# Patient Record
Sex: Female | Born: 1960 | Race: Asian | Hispanic: No | Smoking: Never smoker
Health system: Southern US, Community
[De-identification: ages and names within clinical notes are randomized; demographics above are authoritative.]

## PROBLEM LIST (undated history)

## (undated) DIAGNOSIS — I10 Essential (primary) hypertension: Secondary | ICD-10-CM

## (undated) DIAGNOSIS — G4710 Hypersomnia, unspecified: Secondary | ICD-10-CM

## (undated) DIAGNOSIS — R0683 Snoring: Secondary | ICD-10-CM

## (undated) DIAGNOSIS — E785 Hyperlipidemia, unspecified: Secondary | ICD-10-CM

## (undated) DIAGNOSIS — T7840XA Allergy, unspecified, initial encounter: Secondary | ICD-10-CM

## (undated) HISTORY — DX: Snoring: R06.83

## (undated) HISTORY — DX: Essential (primary) hypertension: I10

## (undated) HISTORY — DX: Hyperlipidemia, unspecified: E78.5

## (undated) HISTORY — DX: Hypersomnia, unspecified: G47.10

## (undated) HISTORY — DX: Allergy, unspecified, initial encounter: T78.40XA

---

## 2001-06-26 ENCOUNTER — Emergency Department (HOSPITAL_COMMUNITY): Admission: EM | Admit: 2001-06-26 | Discharge: 2001-06-26 | Payer: Self-pay | Admitting: Emergency Medicine

## 2001-06-26 ENCOUNTER — Encounter: Payer: Self-pay | Admitting: Emergency Medicine

## 2002-06-29 ENCOUNTER — Inpatient Hospital Stay (HOSPITAL_COMMUNITY): Admission: AD | Admit: 2002-06-29 | Discharge: 2002-06-29 | Payer: Self-pay | Admitting: Family Medicine

## 2006-08-06 ENCOUNTER — Emergency Department (HOSPITAL_COMMUNITY): Admission: EM | Admit: 2006-08-06 | Discharge: 2006-08-07 | Payer: Self-pay | Admitting: Emergency Medicine

## 2008-01-03 ENCOUNTER — Ambulatory Visit: Payer: Self-pay | Admitting: Cardiology

## 2008-01-04 ENCOUNTER — Ambulatory Visit: Payer: Self-pay | Admitting: Cardiology

## 2008-01-04 LAB — CONVERTED CEMR LAB
Cholesterol: 161 mg/dL (ref 0–200)
HDL: 50 mg/dL (ref >39.0)
LDL Cholesterol: 90 mg/dL (ref 0–99)
Total CHOL/HDL Ratio: 3.2
Triglycerides: 106 mg/dL (ref 0–149)
VLDL: 21 mg/dL (ref 0–40)

## 2008-01-17 ENCOUNTER — Ambulatory Visit: Payer: Self-pay

## 2008-01-17 ENCOUNTER — Ambulatory Visit: Payer: Self-pay | Admitting: Cardiology

## 2008-02-06 ENCOUNTER — Ambulatory Visit: Payer: Self-pay | Admitting: Cardiology

## 2009-01-02 ENCOUNTER — Encounter (INDEPENDENT_AMBULATORY_CARE_PROVIDER_SITE_OTHER): Payer: Self-pay | Admitting: *Deleted

## 2010-09-23 NOTE — Procedures (Signed)
[facility]                              EXERCISE MELL, MELLOTT ANN                     MRN:          [id number]  DATE:01/17/2008                            DOB:          [birth year]    INDICATION:  Multiple cardiac risk factors including diabetes in a  patient who would like to increase her level of exercise.   PROCEDURE:  The patient exercised using the standard Bruce protocol on  the treadmill.  She reached 95% of her predicted maximum heart rate with  a peak heart rate of 164.  She exercised for 10 minutes, which is 11.80  METS.  She did reach a maximal blood pressure of 201/101 at peak  exercise signifying a hypertensive blood pressure response.  On review  of her exercise electrocardiogram, there is no evidence for ischemic ST  segment changes.  There are some mild upsloping ST depressions at peak  exercise that are nonspecific.  The patient had no chest pain, exercise  was terminated due to fatigue.   ASSESSMENT/PLAN:  This is a negative exercise treadmill for ischemia.  She did have excellent exercise capacity reaching 11.8 METS. There was a  hypertensive blood pressure response.  I will change the patient's  metoprolol to Coreg 12.5 mg po bid for its better blood pressure  lowering capacity.     Marca Ancona, MD  Electronically Signed    DM/MedQ  DD: 01/17/2008  DT: 01/18/2008  Job #: [id number]   cc:   Jonita Albee, M.D.  Noralyn Pick. Eden Emms, MD, [facility]

## 2010-09-23 NOTE — Assessment & Plan Note (Signed)
[facility]                            CARDIOLOGY OFFICE NOTE   Nancy Evans, Nancy Evans                     MRN:          [id number]  DATE:01/03/2008                            DOB:          11-20-1960    PRIMARY CARE PHYSICIAN:  Dr. Thayer Ohm Guest.   HISTORY OF PRESENT ILLNESS:  This is a 50 year old with history of  hypertension and recently diagnosed type 2 diabetes who was referred to  cardiology clinic for evaluation for coronary disease in the setting of  multiple coronary artery disease risk factors.  The patient was recently  diagnosed with diabetes.  She does have hypertension.  She also has a  family history of early coronary artery disease and that her sister had  a heart attack apparently at the age of 39.  The patient states that she  actually does not have a whole lot of symptomatology.  She tries to walk  30-45 minutes every night with her husband.  She denies any dyspnea on  exertion while walking.  She is able to climb stairs without difficulty.  She has had no episodes of chest pain.  She has had no episodes of  syncope or presyncope.  She does not get pain in her legs when she  walks.  She does not smoke.  She has not been checking her blood sugar  level; however, she does have machine and she is just waiting to be  taught how to use it.  She does bring along her home blood pressure  numbers which show systolic blood pressure mainly in the 130s to 140s.   PAST MEDICAL HISTORY:  1. Type 2 diabetes.  2. Hypertension.  3. History of angioedema with ACE inhibitor.   EKG today, normal sinus rhythm with nonspecific T-wave flattening.  Most  recent labs July 2009, hemoglobin A1c of 7.5.   SOCIAL HISTORY:  The patient does not smoke, does not drink alcohol.  She is married.  She has two grown children.  She does work, and she has  a sedentary job at a [employer].   FAMILY HISTORY:  The patient's father had a heart attack at the age of  48,  the patient's sister apparently had a heart attack at the age of 22,  and the patient's aunt also had a heart attack though she is not sure  how old she was at that time.   MEDICATIONS:  1. Metformin 500 mg b.i.d.  2. Norvasc 10 mg daily.  3. Aspirin 81 mg daily.  4. Metoprolol 50 mg b.i.d.   REVIEW OF SYSTEMS:  Negative except as noted in history of present  illness.   PHYSICAL EXAMINATION:  VITAL SIGNS:  Blood pressure 139/90, heart rate  70 and regular, and weight is 149 pounds.  GENERAL:  No apparent distress.  This is a mildly obese female.  NEUROLOGICAL:  Alert and oriented x3.  Normal affect.  NECK:  No JVD, no thyromegaly, and no thyroid nodule.  LUNGS:  Clear to auscultation bilaterally.  Normal respiratory effort.  CARDIOVASCULAR:  Heart, regular S1, S2.  No S3.  No  S4.  There is a soft  1/6 crescendo-decrescendo systolic murmur at the upper sternal border.  There is no peripheral edema.  A 2+ dorsalis pedis pulses bilaterally.  No carotid bruit.  ABDOMEN:  Soft, nontender, and mildly obese.  No hepatosplenomegaly.  Normal bowel sounds.  EXTREMITIES:  No clubbing or cyanosis.  MUSCULOSKELETAL:  Normal exam.  SKIN:  Normal exam.  HEENT:  Normal exam.   ASSESSMENT/PLAN:  This is a 50 year old with diabetes and hypertension  as well as a strong family history of coronary disease who presents for  evaluation of her coronary disease risk factors.  1. Hypertension.  The patient's blood pressure is still above her goal      of less than 130/80.  Her blood pressure log shows it does tend to      run a little bit high.  We will continue her on her Norvasc and on      her metoprolol.  We will add hydrochlorothiazide 25 mg daily to her      regimen.  She is not able to use an ACE or an ARB due to the      history of angioedema.  2. We will need to check the patient's lipids.  My goal LDL for her      would be less than 70.  If her LDL is elevated above this level, I      would  have a low threshold to start statin.  3. Type 2 diabetes.  The patient is newly diagnosed.  She is on      metformin 500 mg twice daily.  She has been to see the nutritionist      at [facility]  We talked about weight loss.  4. Coronary disease risk.  The patient is asymptomatic.  However, she      does have multiple risk factors for coronary disease.  She will      continue on her 81 mg aspirin and we will also set her up for an      exercise treadmill test.  She does have an essentially normal EKG.      This is especially given the fact that she does want to increase      the level of her exercise regimen, so she will be able to lose      weight.      Marca Ancona, MD  Electronically Signed    DM/MedQ  DD: 01/03/2008  DT: 01/04/2008  Job #: [id number]   cc:   Jonita Albee, M.D.  Arturo Morton. Riley Kill, MD, [facility]

## 2011-11-11 ENCOUNTER — Other Ambulatory Visit: Payer: Self-pay | Admitting: Family Medicine

## 2011-12-11 ENCOUNTER — Ambulatory Visit (INDEPENDENT_AMBULATORY_CARE_PROVIDER_SITE_OTHER): Payer: Federal, State, Local not specified - PPO | Admitting: Family Medicine

## 2011-12-11 VITALS — BP 208/108 | HR 63 | Temp 97.9°F | Resp 16 | Ht 58.5 in | Wt 143.6 lb

## 2011-12-11 DIAGNOSIS — E785 Hyperlipidemia, unspecified: Secondary | ICD-10-CM

## 2011-12-11 DIAGNOSIS — E119 Type 2 diabetes mellitus without complications: Secondary | ICD-10-CM | POA: Insufficient documentation

## 2011-12-11 DIAGNOSIS — J309 Allergic rhinitis, unspecified: Secondary | ICD-10-CM

## 2011-12-11 DIAGNOSIS — I10 Essential (primary) hypertension: Secondary | ICD-10-CM | POA: Insufficient documentation

## 2011-12-11 LAB — COMPREHENSIVE METABOLIC PANEL
ALT: 34 U/L (ref 0–35)
AST: 27 U/L (ref 0–37)
Albumin: 4.4 g/dL (ref 3.5–5.2)
Alkaline Phosphatase: 42 U/L (ref 39–117)
BUN: 13 mg/dL (ref 6–23)
CO2: 27 mEq/L (ref 19–32)
Calcium: 9.3 mg/dL (ref 8.4–10.5)
Chloride: 105 mEq/L (ref 96–112)
Creat: 0.51 mg/dL (ref 0.50–1.10)
Glucose, Bld: 148 mg/dL — ABNORMAL HIGH (ref 70–99)
Potassium: 3.7 mEq/L (ref 3.5–5.3)
Sodium: 139 mEq/L (ref 135–145)
Total Bilirubin: 0.7 mg/dL (ref 0.3–1.2)
Total Protein: 7 g/dL (ref 6.0–8.3)

## 2011-12-11 LAB — POCT CBC
Granulocyte percent: 65.9 %G (ref 37–80)
HCT, POC: 45.6 % (ref 37.7–47.9)
Hemoglobin: 14.3 g/dL (ref 12.2–16.2)
Lymph, poc: 1.8 (ref 0.6–3.4)
MCH, POC: 27.2 pg (ref 27–31.2)
MCHC: 31.4 g/dL — AB (ref 31.8–35.4)
MCV: 86.9 fL (ref 80–97)
MID (cbc): 0.5 (ref 0–0.9)
MPV: 8.2 fL (ref 0–99.8)
POC Granulocyte: 4.5 (ref 2–6.9)
POC LYMPH PERCENT: 27.2 %L (ref 10–50)
POC MID %: 6.9 %M (ref 0–12)
Platelet Count, POC: 294 10*3/uL (ref 142–424)
RBC: 5.25 M/uL (ref 4.04–5.48)
RDW, POC: 13.9 %
WBC: 6.8 10*3/uL (ref 4.6–10.2)

## 2011-12-11 LAB — LIPID PANEL
Cholesterol: 140 mg/dL (ref 0–200)
HDL: 38 mg/dL — ABNORMAL LOW (ref >39)
LDL Cholesterol: 71 mg/dL (ref 0–99)
Total CHOL/HDL Ratio: 3.7 Ratio
Triglycerides: 157 mg/dL — ABNORMAL HIGH (ref <150)
VLDL: 31 mg/dL (ref 0–40)

## 2011-12-11 LAB — POCT GLYCOSYLATED HEMOGLOBIN (HGB A1C): Hemoglobin A1C: 9

## 2011-12-11 MED ORDER — SIMVASTATIN 20 MG PO TABS: 20.0000 mg | ORAL_TABLET | Freq: Every evening | ORAL | Status: DC

## 2011-12-11 MED ORDER — METFORMIN HCL 1000 MG PO TABS: 1000.0000 mg | ORAL_TABLET | Freq: Two times a day (BID) | ORAL | Status: DC

## 2011-12-11 MED ORDER — CARVEDILOL 12.5 MG PO TABS: 12.5000 mg | ORAL_TABLET | Freq: Two times a day (BID) | ORAL | Status: DC

## 2011-12-11 MED ORDER — FEXOFENADINE HCL 180 MG PO TABS: 180.0000 mg | ORAL_TABLET | Freq: Every day | ORAL | Status: DC

## 2011-12-11 MED ORDER — MOMETASONE FUROATE 50 MCG/ACT NA SUSP: 2.0000 | NASAL | Status: DC | PRN

## 2011-12-11 MED ORDER — LOSARTAN POTASSIUM-HCTZ 100-12.5 MG PO TABS: 1.0000 | ORAL_TABLET | Freq: Every day | ORAL | Status: DC

## 2011-12-11 NOTE — Progress Notes (Signed)
@[facility] @  Patient ID: Nancy Evans MRN: [id number], DOB: 1961/03/19, 51 y.o. Date of Encounter: 12/11/2011, 10:41 AM  Primary Physician: No primary provider on file.  Chief Complaint: HTN  HPI: 51 y.o. year old female with history below presents for hypertension follow up.  She ran out of medicine. She works at the [employer].  Diet consists of salt limited foods No CP, HA, visual changes, or focal deficits.   No past medical history on file.   Home Meds: Prior to Admission medications   Medication Sig Start Date End Date Taking? Authorizing Provider  aspirin 81 MG tablet Take 81 mg by mouth daily.   Yes Historical Provider, MD  Bepotastine Besilate (BEPREVE OP) Apply 1 drop to eye as needed.   Yes Historical Provider, MD  carvedilol (COREG) 12.5 MG tablet Take 12.5 mg by mouth 2 (two) times daily with a meal.   Yes Historical Provider, MD  fexofenadine (ALLEGRA) 180 MG tablet Take 180 mg by mouth daily.   Yes Historical Provider, MD  losartan-hydrochlorothiazide (HYZAAR) 100-12.5 MG per tablet Take 1 tablet by mouth daily.   Yes Historical Provider, MD  metFORMIN (GLUCOPHAGE) 1000 MG tablet Take 1,000 mg by mouth 2 (two) times daily with a meal.   Yes Historical Provider, MD  mometasone (NASONEX) 50 MCG/ACT nasal spray Place 2 sprays into the nose as needed.   Yes Historical Provider, MD  simvastatin (ZOCOR) 20 MG tablet Take 20 mg by mouth every evening.   Yes Historical Provider, MD    Allergies:  Allergies  Allergen Reactions  . Lisinopril Swelling    History   Social History  . Marital Status: Married    Spouse Name: N/A    Number of Children: N/A  . Years of Education: N/A   Occupational History  . Not on file.   Social History Main Topics  . Smoking status: Never Smoker   . Smokeless tobacco: Not on file  . Alcohol Use: Not on file  . Drug Use: Not on file  . Sexually Active: Not on file   Other Topics Concern  . Not on file   Social History  Narrative  . No narrative on file     No family history on file.  Review of Systems: Constitutional: negative for chills, fever, night sweats, weight changes, or fatigue  HEENT: negative for vision changes, hearing loss, congestion, rhinorrhea, ST, epistaxis, or sinus pressure Cardiovascular: negative for chest pain, palpitations, or DOE Respiratory: negative for hemoptysis, wheezing, shortness of breath, or cough Abdominal: negative for abdominal pain, nausea, vomiting, diarrhea, or constipation.  Some queasiness last two days Dermatological: negative for rash Neurologic: negative for headache, dizziness, or syncope All other systems reviewed and are otherwise negative with the exception to those above and in the HPI.   Physical Exam: Blood pressure 208/108, pulse 63, temperature 97.9 F (36.6 C), temperature source Oral, resp. rate 16, height 4' 10.5" (1.486 m), weight 143 lb 9.6 oz (65.137 kg), SpO2 98.00%., Body mass index is 29.50 kg/(m^2). General: Well developed, well nourished, in no acute distress. Head: Normocephalic, atraumatic, eyes without discharge, sclera non-icteric, nares are without discharge. Bilateral auditory canals clear, TM's are without perforation, pearly grey and translucent with reflective cone of light bilaterally. Oral cavity moist, posterior pharynx without exudate, erythema, peritonsillar abscess, or post nasal drip.  Neck: Supple. No thyromegaly. Full ROM. No lymphadenopathy. No carotid bruits. Lungs: Clear bilaterally to auscultation without wheezes, rales, or rhonchi. Breathing is unlabored. Heart: RRR  with S1 S2. No murmurs, rubs, or gallops appreciated.  Abdomen: Soft, non-tender, non-distended with normoactive bowel sounds. No hepatosplenomegaly. No rebound/guarding. No obvious abdominal masses. Msk:  Strength and tone normal for age. Extremities/Skin: Warm and dry. No clubbing or cyanosis. No edema. No rashes or suspicious lesions. Distal pulses 2+ and  equal bilaterally. Neuro: Alert and oriented X 3. Moves all extremities spontaneously. Gait is normal. CNII-XII grossly in tact. DTR 2+, cerebellar function intact. Rhomberg normal. Psych:  Responds to questions appropriately with a normal affect.  Microfilament is normal Labs:  CMP pending  ASSESSMENT AND PLAN:  51 y.o. year old female with HTN, DM, and hyperlipidemia 1. Hypertension  POCT CBC, Comprehensive metabolic panel, Lipid panel, Microalbumin, urine, POCT glycosylated hemoglobin (Hb A1C), losartan-hydrochlorothiazide (HYZAAR) 100-12.5 MG per tablet, carvedilol (COREG) 12.5 MG tablet  2. Hyperlipidemia  simvastatin (ZOCOR) 20 MG tablet, metFORMIN (GLUCOPHAGE) 1000 MG tablet  3. Allergic rhinitis  mometasone (NASONEX) 50 MCG/ACT nasal spray, fexofenadine (ALLEGRA) 180 MG tablet  4. Diabetes type 2, controlled  Comprehensive metabolic panel, Lipid panel, Microalbumin, urine, simvastatin (ZOCOR) 20 MG tablet, metFORMIN (GLUCOPHAGE) 1000 MG tablet  5. Type 2 diabetes mellitus      -  Signed, Elvina Sidle, MD 12/11/2011 10:41 AM

## 2011-12-12 LAB — MICROALBUMIN, URINE: Microalb, Ur: 64.12 mg/dL — ABNORMAL HIGH (ref 0.00–1.89)

## 2012-02-01 ENCOUNTER — Ambulatory Visit: Payer: Federal, State, Local not specified - PPO

## 2012-02-01 ENCOUNTER — Ambulatory Visit (INDEPENDENT_AMBULATORY_CARE_PROVIDER_SITE_OTHER): Payer: Federal, State, Local not specified - PPO | Admitting: Family Medicine

## 2012-02-01 VITALS — BP 172/110 | HR 64 | Temp 98.3°F | Resp 16 | Ht 59.0 in | Wt 144.0 lb

## 2012-02-01 DIAGNOSIS — R109 Unspecified abdominal pain: Secondary | ICD-10-CM

## 2012-02-01 DIAGNOSIS — M546 Pain in thoracic spine: Secondary | ICD-10-CM

## 2012-02-01 DIAGNOSIS — I10 Essential (primary) hypertension: Secondary | ICD-10-CM

## 2012-02-01 DIAGNOSIS — M549 Dorsalgia, unspecified: Secondary | ICD-10-CM

## 2012-02-01 DIAGNOSIS — E119 Type 2 diabetes mellitus without complications: Secondary | ICD-10-CM

## 2012-02-01 LAB — POCT UA - MICROSCOPIC ONLY: Yeast, UA: NEGATIVE

## 2012-02-01 LAB — POCT URINALYSIS DIPSTICK
Bilirubin, UA: NEGATIVE
Glucose, UA: 500
Ketones, UA: NEGATIVE
Leukocytes, UA: NEGATIVE
Nitrite, UA: NEGATIVE
pH, UA: 6

## 2012-02-01 LAB — GLUCOSE, POCT (MANUAL RESULT ENTRY): POC Glucose: 233 mg/dl — AB (ref 70–99)

## 2012-02-01 MED ORDER — CYCLOBENZAPRINE HCL 5 MG PO TABS: ORAL_TABLET | ORAL | Status: DC

## 2012-02-01 MED ORDER — TRAMADOL HCL 50 MG PO TABS: 50.0000 mg | ORAL_TABLET | Freq: Four times a day (QID) | ORAL | Status: DC | PRN

## 2012-02-01 NOTE — Patient Instructions (Addendum)
Keep a record of your blood pressures outside of the office and bring them to the next office visit. Recheck in next few weeks. Check your home blood sugars and bring those to your next office visit as well - in the next 2 weeks.  Return to the clinic or go to the nearest emergency room if any of your symptoms worsen or new symptoms occur.

## 2012-02-01 NOTE — Progress Notes (Signed)
Subjective:    Patient ID: Nancy Evans, female    DOB: 1961/01/23, 51 y.o.   MRN: [id number]  HPI [name] is a 51 y.o. female Back pain for past week,  Gradual onset, but worse past few days. Trouble sleeping past few days.  Initially thought slept on it wrong, but didn't go away. NKI.  Tx: Ibuprofen (800mg  - twice yesterday), heat, cold compress.  Feels a little better in office. No bowel or bladder incontinence, no saddle anesthesia, no lower extremity weakness.  Pain behind shoulder about the same time.  No chest or abdominal pain.  No urinary symptoms, but hx of kidney stone 5 years ago.  Pain with twisting, movement. Burning pain. No leg radiation. R flank area.  Hx of HTN - home bps 140/90.  DM - not checking home blood sugars.  Taking metformin 1000mg  bid.    .Review of Systems  Constitutional: Negative for fatigue and unexpected weight change.  Respiratory: Negative for chest tightness and shortness of breath.   Cardiovascular: Negative for chest pain, palpitations and leg swelling.  Gastrointestinal: Negative for abdominal pain and blood in stool.  Genitourinary: Negative for dysuria, urgency, frequency and hematuria.  Neurological: Negative for dizziness, syncope, light-headedness and headaches.       Objective:   Physical Exam  Constitutional: She is oriented to person, place, and time. She appears well-developed and well-nourished.  HENT:  Head: Normocephalic and atraumatic.  Neck: Normal range of motion.  Cardiovascular: Normal rate, regular rhythm, normal heart sounds and intact distal pulses.   Pulmonary/Chest: Effort normal and breath sounds normal.  Abdominal: Soft. She exhibits no distension. There is no tenderness. There is no rebound and no guarding.  Musculoskeletal:       Left shoulder: Normal. She exhibits normal range of motion, no tenderness and no bony tenderness.       Lumbar back: She exhibits tenderness. She exhibits normal range of motion,  no bony tenderness, no swelling and no spasm.       Back:       Arms: Neurological: She is alert and oriented to person, place, and time.  Skin: Skin is warm and dry. No rash noted.  Psychiatric: She has a normal mood and affect. Her behavior is normal.     [facility] reading (PRIMARY) by  Dr. Neva Seat: KUB: negative, LS spine 2 view - NAD.   Results for orders placed in visit on 02/01/12  POCT UA - MICROSCOPIC ONLY      Component Value Range   WBC, Ur, HPF, POC 0-2     RBC, urine, microscopic 0-1     Bacteria, U Microscopic trace     Mucus, UA neg     Epithelial cells, urine per micros 0-2     Crystals, Ur, HPF, POC neg     Casts, Ur, LPF, POC neg     Yeast, UA neg    POCT URINALYSIS DIPSTICK      Component Value Range   Color, UA yellow     Clarity, UA clear     Glucose, UA 500     Bilirubin, UA neg     Ketones, UA neg     Spec Grav, UA 1.015     Blood, UA neg     pH, UA 6.0     Protein, UA 30     Urobilinogen, UA 0.2     Nitrite, UA neg     Leukocytes, UA Negative    GLUCOSE, POCT (  MANUAL RESULT ENTRY)      Component Value Range   POC Glucose 233 (*) 70 - 99 mg/dl        Assessment & Plan:  Nancy Evans is a 51 y.o. female 1. Right flank pain  DG Abd 1 View, DG Lumbar Spine 2-3 Views, POCT UA - Microscopic Only, POCT urinalysis dipstick   Suspected msk source of pain as able to reproduce with mvmt.  Sx's improving.   U/a overall reassuring - no hematuria. Trial of flexeril 5mg  tid prn - sed, ultram Q6h prn increased pain. Recheck in next 5-7 days if not improving - sooner if worse. Tylenol with mild pain.   HTN -  Hold on NSAIDs at this point with HTN control (likely pain component).  Check outside bp's. rtc if remain above over 140/90.  Creatinine 0.51 on 12/11/11.   DM2. - uncontrolled based on today's visit. Check home blood sugars and recheck in next 2 weeks for this.

## 2012-02-17 ENCOUNTER — Ambulatory Visit (INDEPENDENT_AMBULATORY_CARE_PROVIDER_SITE_OTHER): Payer: Federal, State, Local not specified - PPO | Admitting: Family Medicine

## 2012-02-17 VITALS — BP 152/98 | HR 71 | Temp 98.2°F | Resp 16 | Ht 59.0 in | Wt 140.0 lb

## 2012-02-17 DIAGNOSIS — J029 Acute pharyngitis, unspecified: Secondary | ICD-10-CM

## 2012-02-17 DIAGNOSIS — E119 Type 2 diabetes mellitus without complications: Secondary | ICD-10-CM

## 2012-02-17 DIAGNOSIS — I10 Essential (primary) hypertension: Secondary | ICD-10-CM

## 2012-02-17 LAB — POCT RAPID STREP A (OFFICE): Rapid Strep A Screen: NEGATIVE

## 2012-02-17 NOTE — Patient Instructions (Addendum)
Take the medicine faithfully twice daily as we discussed. If your blood pressure list continues to show the lower number (diastolic) to be greater than 90, then go ahead and increase the carvedilol to 1 when you get up, one at dinnertime at work, and one half to get home before your bed.  Take the metformin twice daily as directed.  Treat sore throat symptomatically   Strep Throat Strep throat is an infection of the throat caused by a bacteria named Streptococcus pyogenes. Your caregiver may call the infection streptococcal "tonsillitis" or "pharyngitis" depending on whether there are signs of inflammation in the tonsils or back of the throat. Strep throat is most common in children from 61 to 59 years old during the cold months of the year, but it can occur in people of any age during any season. This infection is spread from person to person (contagious) through coughing, sneezing, or other close contact. SYMPTOMS   Fever or chills.  Painful, swollen, red tonsils or throat.  Pain or difficulty when swallowing.  White or yellow spots on the tonsils or throat.  Swollen, tender lymph nodes or "glands" of the neck or under the jaw.  Red rash all over the body (rare). DIAGNOSIS  Many different infections can cause the same symptoms. A test must be done to confirm the diagnosis so the right treatment can be given. A "rapid strep test" can help your caregiver make the diagnosis in a few minutes. If this test is not available, a light swab of the infected area can be used for a throat culture test. If a throat culture test is done, results are usually available in a day or two. TREATMENT  Strep throat is treated with antibiotic medicine. HOME CARE INSTRUCTIONS   Gargle with 1 tsp of salt in 1 cup of warm water, 3 to 4 times per day or as needed for comfort.  Family members who also have a sore throat or fever should be tested for strep throat and treated with antibiotics if they have the strep  infection.  Make sure everyone in your household washes their hands well.  Do not share food, drinking cups, or personal items that could cause the infection to spread to others.  You may need to eat a soft food diet until your sore throat gets better.  Drink enough water and fluids to keep your urine clear or pale yellow. This will help prevent dehydration.  Get plenty of rest.  Stay home from school, daycare, or work until you have been on antibiotics for 24 hours.  Only take over-the-counter or prescription medicines for pain, discomfort, or fever as directed by your caregiver.  If antibiotics are prescribed, take them as directed. Finish them even if you start to feel better. SEEK MEDICAL CARE IF:   The glands in your neck continue to enlarge.  You develop a rash, cough, or earache.  You cough up green, yellow-brown, or bloody sputum.  You have pain or discomfort not controlled by medicines.  Your problems seem to be getting worse rather than better. SEEK IMMEDIATE MEDICAL CARE IF:   You develop any new symptoms such as vomiting, severe headache, stiff or painful neck, chest pain, shortness of breath, or trouble swallowing.  You develop severe throat pain, drooling, or changes in your voice.  You develop swelling of the neck, or the skin on the neck becomes red and tender.  You have a fever.  You develop signs of dehydration, such as fatigue, dry  mouth, and decreased urination.  You become increasingly sleepy, or you cannot wake up completely. Document Released: 04/24/2000 Document Revised: 07/20/2011 Document Reviewed: 06/26/2010 [facility] Patient Information 2013 [place], [place].

## 2012-02-17 NOTE — Progress Notes (Signed)
Subjective: Patient is here for couple of things. She's been monitoring her blood pressure at home it continues to run high. She apparently takes her carvedilol once daily. 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 She takes her losartan after midnight when she gets home from work. She's been taking one metformin a day also. This inadequate compliance is probably watch him having suffered trouble controlling blood pressure blood sugars.  She has had a bad sore throat since yesterday. Now has a cough along with it. No fevers.  Objective pleasant lady in no major distress. She does cough some. Her TMs both have wax in them, dry and crusted. Her throat is only minimally erythematous. Strep screen was taken. Neck supple without significant nodes. Chest is clear to auscultation. Heart regular without murmur scalp slight redness.  Assessment: Pharyngitis Hypertension, inadequate control Diabetes, inadequate control  Plan: Discuss with her the need for taking the medications as directed. Advised taking the carvedilol when she gets up in the morning and again at midnight. She is to monitor blood pressure and if it does not come down to a diastolic below 90 she is to increase the carvedilol to 3 times daily. I will have her come back in 3 or 4 weeks to follow up again on this. she may well need an increased medications.  Told her this is probably a power pharyngitis, but strep screen is pending.  Results for orders placed in visit on 02/17/12  POCT RAPID STREP A (OFFICE)      Component Value Range   Rapid Strep A Screen Negative  Negative   Treat the throat symptomatically. Return in about 3 weeks to followup on the blood pressure.

## 2012-04-03 ENCOUNTER — Telehealth: Payer: Self-pay | Admitting: Family Medicine

## 2012-04-03 NOTE — Telephone Encounter (Signed)
Left message that i will try to reach her again this week.  Following up on her visit in September.  On the radiologist's overread of her xray, there was a question of possible abnormal area above the bladder in the pelvis, and plan to discuss with patient if this has been noticed or worked up prior.  If not, I can arrange for this workup, including possible ct scan or ultrasound.  i plan on calling her again to try to discuss this and options.

## 2012-04-05 ENCOUNTER — Telehealth: Payer: Self-pay | Admitting: Family Medicine

## 2012-04-05 NOTE — Telephone Encounter (Signed)
Tried to reach again to discuss prior xray.  On message - asked her to provide [place] with a convenient time to reach her as I would like to discuss her prior xray with her and check status.  i will try to reach her again in the next few days.

## 2012-04-06 ENCOUNTER — Telehealth: Payer: Self-pay | Admitting: Family Medicine

## 2012-04-06 NOTE — Telephone Encounter (Signed)
Left message - advised again to let us know a better time to reach her and best number to reach her.

## 2012-04-26 ENCOUNTER — Telehealth: Payer: Self-pay | Admitting: Family Medicine

## 2012-04-26 NOTE — Telephone Encounter (Signed)
Called patient as have not received a call back from prior attempts and messages. Spouse answered phone.  Advised that between 11 and 2 would be a good time to reach her. I will try to reach her tomorrow at this home number.

## 2012-04-27 ENCOUNTER — Telehealth: Payer: Self-pay | Admitting: Family Medicine

## 2012-04-27 NOTE — Telephone Encounter (Signed)
Attempted call to home number at recommended time from spouse yesterday at this number.  Left message - will try to call again.

## 2012-04-29 ENCOUNTER — Telehealth: Payer: Self-pay | Admitting: Family Medicine

## 2012-04-29 NOTE — Telephone Encounter (Signed)
Sent letter to her to advise.

## 2012-04-29 NOTE — Telephone Encounter (Signed)
Will try to call her next week as well.

## 2012-04-29 NOTE — Telephone Encounter (Signed)
Attempted call again - voicemail left again that I was trying to reach her.  Advised her to call back to clinical team leader or Copy.  I am trying to reach her to discuss an xray she had on 02/01/12 - a 1 view abdomen for flank pain.  The radiolologist on overread saw a possible rounded opacity in the pelvis which appears to be  superior to the urinary bladder. This could be due to an enlarged uterus or pelvic mass, but the radiologist asked if another exam had been performed.  I have been trying to reach her to discus whether she has had other abdominal exams/CT etc, but have been unsuccessful at reaching her. If she has not had other abdominal imaging studies, I would be happy to order a CT scan of abd/pelvis to further evaluate the area that the radiologist is referring to, or we can discuss this further over the phone or in the office - whichever she chooses.  I will be out of town until 05/06/12, but can follow up with her then. Let me know if any clarification needed.   Also in review of her chart - Dr Alwyn Ren had wanted to recheck with her in 3-4 weeks at the 02/17/12 office visit for her blood pressure, and she can follow up with either of [place] for this.

## 2012-05-06 NOTE — Telephone Encounter (Signed)
Tried to call pt on both H and cell #s. No answer/LMOM on both #s for pt to CB concerning her xray.

## 2012-05-11 NOTE — Telephone Encounter (Signed)
Unable to reach letter has been sent

## 2012-12-03 ENCOUNTER — Other Ambulatory Visit: Payer: Self-pay | Admitting: Family Medicine

## 2012-12-09 ENCOUNTER — Other Ambulatory Visit: Payer: Self-pay | Admitting: Family Medicine

## 2012-12-09 ENCOUNTER — Ambulatory Visit: Payer: Federal, State, Local not specified - PPO

## 2012-12-09 ENCOUNTER — Ambulatory Visit (INDEPENDENT_AMBULATORY_CARE_PROVIDER_SITE_OTHER): Payer: Federal, State, Local not specified - PPO | Admitting: Family Medicine

## 2012-12-09 VITALS — BP 165/95 | HR 76 | Temp 98.9°F | Resp 16 | Ht 59.0 in | Wt 140.0 lb

## 2012-12-09 DIAGNOSIS — E785 Hyperlipidemia, unspecified: Secondary | ICD-10-CM

## 2012-12-09 DIAGNOSIS — Z9119 Patient's noncompliance with other medical treatment and regimen: Secondary | ICD-10-CM

## 2012-12-09 DIAGNOSIS — R9389 Abnormal findings on diagnostic imaging of other specified body structures: Secondary | ICD-10-CM

## 2012-12-09 DIAGNOSIS — I10 Essential (primary) hypertension: Secondary | ICD-10-CM

## 2012-12-09 DIAGNOSIS — E119 Type 2 diabetes mellitus without complications: Secondary | ICD-10-CM

## 2012-12-09 DIAGNOSIS — Z91199 Patient's noncompliance with other medical treatment and regimen due to unspecified reason: Secondary | ICD-10-CM

## 2012-12-09 LAB — POCT URINE PREGNANCY: Preg Test, Ur: NEGATIVE

## 2012-12-09 LAB — POCT GLYCOSYLATED HEMOGLOBIN (HGB A1C): Hemoglobin A1C: 9.2

## 2012-12-09 MED ORDER — METFORMIN HCL 1000 MG PO TABS: 1000.0000 mg | ORAL_TABLET | Freq: Two times a day (BID) | ORAL | Status: DC

## 2012-12-09 MED ORDER — CARVEDILOL 12.5 MG PO TABS: 12.5000 mg | ORAL_TABLET | Freq: Two times a day (BID) | ORAL | Status: DC

## 2012-12-09 MED ORDER — SIMVASTATIN 20 MG PO TABS: 20.0000 mg | ORAL_TABLET | Freq: Every day | ORAL | Status: DC

## 2012-12-09 MED ORDER — LOSARTAN POTASSIUM-HCTZ 100-25 MG PO TABS: 1.0000 | ORAL_TABLET | Freq: Every day | ORAL | Status: DC

## 2012-12-09 NOTE — Patient Instructions (Signed)

## 2012-12-09 NOTE — Progress Notes (Signed)
 [facility]:  Office Visit  Chief Complaint:  Chief Complaint  Patient presents with  . Medication Refill    Losatan, Simvastatin, Coreg    HPI: Nancy Evans is a 52 y.o. female who complains of : 1. She has been taking meds regular for HTN-she has been taking coreg TID but she is only taking it BID. She does not remember and foregets to take it during the day. Last dose of both Losartan HCTZ 100/12.5 mg and also Coreg 12.5 mg BID last night. No SEs. Does not take BP cuff at home or work.  She does not follow any special lowfat or low salt diet. Sometimes she has HAs. Denies dizziness 2.  No SEs with medicine. She already ate today. Took Simvastatin last night.  3. Diabetes- denies neuropathy, last eye exam 2 years ago. No polydipsia or polyuria. She does not check her feet regular. No hypolgycemia except 1 x in the last 6 months Takes most meds at night. 4. She also had an abnormal xray of abdomen in 01/2012 concerning for uterine enlargement vs pelvic mass. Multiple attempts were made to get patient in but apparently she did not follow up. Below is the phone note from Dr. Neva Seat.  Attempted call again - voicemail left again that I was trying to reach her. Advised her to call back to clinical team leader or Copy.  I am trying to reach her to discuss an xray she had on 02/01/12 - a 1 view abdomen for flank pain. The radiolologist on overread saw a possible rounded opacity in the pelvis which appears to be  superior to the urinary bladder. This could be due to an enlarged uterus or pelvic mass, but the radiologist asked if another exam had been performed. I have been trying to reach her to discus whether she has had other abdominal exams/CT etc, but have been unsuccessful at reaching her. If she has not had other abdominal imaging studies, I would be happy to order a CT scan of abd/pelvis to further evaluate the area that the radiologist is referring to, or we  can discuss this further over the phone or in the office - whichever she chooses. I will be out of town until 05/06/12, but can follow up with her then. Let me know if any clarification needed.  Also in review of her chart - Dr Alwyn Ren had wanted to recheck with her in 3-4 weeks at the 02/17/12 office visit for her blood pressure, and she can follow up with either of [place] for this.    Past Medical History  Diagnosis Date  . Hypertension   . Hyperlipidemia   . Allergy   . Diabetes mellitus    No past surgical history on file. History   Social History  . Marital Status: Married    Spouse Name: N/A    Number of Children: N/A  . Years of Education: N/A   Social History Main Topics  . Smoking status: Never Smoker   . Smokeless tobacco: None  . Alcohol Use: None  . Drug Use: None  . Sexually Active: None   Other Topics Concern  . None   Social History Narrative  . None   Family History  Problem Relation Age of Onset  . Kidney disease Mother   . Heart disease Father   . Heart disease Sister    Allergies  Allergen Reactions  . Lisinopril Swelling   Prior to Admission medications   Medication  Sig Start Date End Date Taking? Authorizing Provider  aspirin 81 MG tablet Take 81 mg by mouth daily.   Yes Historical Provider, MD  carvedilol (COREG) 12.5 MG tablet Take 1 tablet (12.5 mg total) by mouth 2 (two) times daily with a meal. Needs office visit 12/03/12  Yes Geroge Baseman Marte, PA-C  cyclobenzaprine (FLEXERIL) 5 MG tablet 1 pill by mouth up to every 8 hours as needed. Start with one pill by mouth each bedtime as needed due to sedation 02/01/12  Yes Shade Flood, MD  fexofenadine (ALLEGRA) 180 MG tablet Take 1 tablet (180 mg total) by mouth daily. 12/11/11  Yes Elvina Sidle, MD  losartan-hydrochlorothiazide (HYZAAR) 100-12.5 MG per tablet Take 1 tablet by mouth daily. 12/11/11  Yes Elvina Sidle, MD  metFORMIN (GLUCOPHAGE) 1000 MG tablet Take 1 tablet (1,000 mg total) by mouth 2  (two) times daily with a meal. 12/11/11  Yes Elvina Sidle, MD  simvastatin (ZOCOR) 20 MG tablet Take 1 tablet (20 mg total) by mouth every evening. 12/11/11  Yes Elvina Sidle, MD  mometasone (NASONEX) 50 MCG/ACT nasal spray Place 2 sprays into the nose as needed. 12/11/11   Elvina Sidle, MD     ROS: The patient denies fevers, chills, night sweats, unintentional weight loss, chest pain, palpitations, wheezing, dyspnea on exertion, nausea, vomiting, abdominal pain, dysuria, hematuria, melena, numbness, weakness, or tingling.   All other systems have been reviewed and were otherwise negative with the exception of those mentioned in the HPI and as above.    PHYSICAL EXAM: Filed Vitals:   12/09/12 1159  BP: 165/95  Pulse: 76  Temp: 98.9 F (37.2 C)  Resp: 16   Filed Vitals:   12/09/12 1159  Height: 4\' 11"  (1.499 m)  Weight: 140 lb (63.504 kg)   Body mass index is 28.26 kg/(m^2).  General: Alert, no acute distress HEENT:  Normocephalic, atraumatic, oropharynx patent. EOMI, PERRLA, fundoscopic exam nl Cardiovascular:  Regular rate and rhythm, no rubs murmurs or gallops.  No Carotid bruits, radial pulse intact. No pedal edema.  Respiratory: Clear to auscultation bilaterally.  No wheezes, rales, or rhonchi.  No cyanosis, no use of accessory musculature GI: No organomegaly, abdomen is soft and non-tender, positive bowel sounds.  No masses. Skin: No rashes. Neurologic: Facial musculature symmetric. Microfilament exam nl Psychiatric: Patient is appropriate throughout our interaction. Lymphatic: No cervical lymphadenopathy Musculoskeletal: Gait intact.   LABS: Results for orders placed in visit on 12/09/12  POCT GLYCOSYLATED HEMOGLOBIN (HGB A1C)      Result Value Range   Hemoglobin A1C 9.2    POCT URINE PREGNANCY      Result Value Range   Preg Test, Ur Negative       EKG/XRAY:   Primary read interpreted by Dr. Conley Rolls at [facility] No obstruction, please  Comment on enlarged uterus vs  pelvic mass Comparison xray on 01/31/13    ASSESSMENT/PLAN: Encounter Diagnoses  Name Primary?  . HTN (hypertension) Yes  . Diabetes   . Other and unspecified hyperlipidemia   . Abnormal x-ray    HTN not well controlled for DM We will dc her losartan HCTZ 100/12.5 mg and increase it to 100/25 mg Refilled other chronic meds, Labs pending Xrays appear to be normal, unchanged Will await for official xray results Recommend : ADA diet, BP goal <140/90, daily foot exams, tobacco cessation if smoking, annual eye exam, annual flu vaccine, PNA vaccine if age and time appropriate.  Gross sideeffects, risk and benefits, and alternatives of  medications d/w patient. Patient is aware that all medications have potential sideeffects and we are unable to predict every sideeffect or drug-drug interaction that may occur. F/u 3 months for DM and HTN recheck    ,  PHUONG, DO 12/09/2012 1:41 PM    8/1/ at 4:14  Spoke to patient about xray results IMPRESSION:  No bowel obstruction or free air. No evidence o mass effect within  the pelvis. Constipation.  Clinically significant discrepancy from primary report, if  provided: None

## 2012-12-10 LAB — COMPREHENSIVE METABOLIC PANEL WITH GFR
ALT: 41 U/L — ABNORMAL HIGH (ref 0–35)
AST: 29 U/L (ref 0–37)
Alkaline Phosphatase: 49 U/L (ref 39–117)
CO2: 28 meq/L (ref 19–32)
Creat: 0.91 mg/dL (ref 0.50–1.10)
Total Bilirubin: 0.5 mg/dL (ref 0.3–1.2)

## 2012-12-10 LAB — MICROALBUMIN / CREATININE URINE RATIO
Creatinine, Urine: 146.2 mg/dL
Microalb Creat Ratio: 361.7 mg/g — ABNORMAL HIGH (ref 0.0–30.0)
Microalb, Ur: 52.88 mg/dL — ABNORMAL HIGH (ref 0.00–1.89)

## 2012-12-10 LAB — COMPREHENSIVE METABOLIC PANEL
Albumin: 4.5 g/dL (ref 3.5–5.2)
BUN: 17 mg/dL (ref 6–23)
Calcium: 10.2 mg/dL (ref 8.4–10.5)
Chloride: 101 mEq/L (ref 96–112)
Glucose, Bld: 183 mg/dL — ABNORMAL HIGH (ref 70–99)
Potassium: 3.8 mEq/L (ref 3.5–5.3)
Sodium: 139 mEq/L (ref 135–145)
Total Protein: 7.8 g/dL (ref 6.0–8.3)

## 2012-12-10 LAB — TSH: TSH: 2.16 u[IU]/mL (ref 0.350–4.500)

## 2012-12-10 LAB — LDL CHOLESTEROL, DIRECT: Direct LDL: 55 mg/dL

## 2012-12-17 ENCOUNTER — Telehealth: Payer: Self-pay | Admitting: Family Medicine

## 2012-12-17 NOTE — Telephone Encounter (Signed)
Spoke to patient about labs, she is doing better. She feelsbetter with med changes. I will defer referring her to kidney specialist for now, will check in 3 months creatinine nad microalbumin, She is going to try harder taking care of her HTN and DM> Goals for both given to her. F/u sooner if not met.

## 2013-02-18 ENCOUNTER — Ambulatory Visit (INDEPENDENT_AMBULATORY_CARE_PROVIDER_SITE_OTHER): Payer: Federal, State, Local not specified - PPO | Admitting: Emergency Medicine

## 2013-02-18 ENCOUNTER — Ambulatory Visit: Payer: Federal, State, Local not specified - PPO

## 2013-02-18 VITALS — BP 164/112 | HR 82 | Temp 98.5°F | Resp 20 | Ht 58.5 in | Wt 138.8 lb

## 2013-02-18 DIAGNOSIS — I10 Essential (primary) hypertension: Secondary | ICD-10-CM

## 2013-02-18 DIAGNOSIS — M549 Dorsalgia, unspecified: Secondary | ICD-10-CM

## 2013-02-18 DIAGNOSIS — R109 Unspecified abdominal pain: Secondary | ICD-10-CM

## 2013-02-18 DIAGNOSIS — R0683 Snoring: Secondary | ICD-10-CM

## 2013-02-18 MED ORDER — CARVEDILOL 25 MG PO TABS: ORAL_TABLET | ORAL | Status: DC

## 2013-02-18 MED ORDER — CYCLOBENZAPRINE HCL 5 MG PO TABS: ORAL_TABLET | ORAL | Status: DC

## 2013-02-18 MED ORDER — CARVEDILOL 12.5 MG PO TABS: ORAL_TABLET | ORAL | Status: DC

## 2013-02-18 NOTE — Patient Instructions (Signed)
He can take Tylenol for back pain. He can use Flexeril as a muscle relaxant. Appointments are being made for you to have a sleep study and to see the cardiologist. Your blood pressure medication has been increased to

## 2013-02-18 NOTE — Progress Notes (Addendum)
Subjective:  This chart was scribed for Nancy Edin, MD by Arlan Organ, ED Scribe. This patient was seen in room Room 13 and the patient's care was started 3:44 PM.    Patient ID: Nancy Evans, female    DOB: 1960/08/06, 52 y.o.   MRN: [id number]  HPI HPI Comments: Nancy Evans is a 52 y.o. female who presents to [facility] complaining of upper back pain that started 2 weeks ago. Pt denies any recent injury. Describes the pain as dull. Pt states she has been using massagers to help with pain with mild relief. 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 Pt denies hx of back pain. Pt denies weakness or numbness. Pt has a hx of HTN.   Review of Systems  Musculoskeletal: Positive for back pain.  Neurological: Negative for weakness and numbness.    Past Medical History  Diagnosis Date  . Hypertension   . Hyperlipidemia   . Allergy   . Diabetes mellitus     History   Social History  . Marital Status: Married    Spouse Name: N/A    Number of Children: N/A  . Years of Education: N/A   Occupational History  . Not on file.   Social History Main Topics  . Smoking status: Never Smoker   . Smokeless tobacco: Not on file  . Alcohol Use: Not on file  . Drug Use: Not on file  . Sexual Activity: Not on file   Other Topics Concern  . Not on file   Social History Narrative  . No narrative on file   History reviewed. No pertinent past surgical history.  Results for orders placed in visit on 12/09/12  COMPREHENSIVE METABOLIC PANEL      Result Value Range   Sodium 139  135 - 145 mEq/L   Potassium 3.8  3.5 - 5.3 mEq/L   Chloride 101  96 - 112 mEq/L   CO2 28  19 - 32 mEq/L   Glucose, Bld 183 (*) 70 - 99 mg/dL   BUN 17  6 - 23 mg/dL   Creat 1.19  1.47 - 8.29 mg/dL   Total Bilirubin 0.5  0.3 - 1.2 mg/dL   Alkaline Phosphatase 49  39 - 117 U/L   AST 29  0 - 37 U/L   ALT 41 (*) 0 - 35 U/L   Total Protein 7.8  6.0 - 8.3 g/dL   Albumin 4.5  3.5 - 5.2  g/dL   Calcium 56.2  8.4 - 13.0 mg/dL  TSH      Result Value Range   TSH 2.160  0.350 - 4.500 uIU/mL  MICROALBUMIN / CREATININE URINE RATIO      Result Value Range   Microalb, Ur 52.88 (*) 0.00 - 1.89 mg/dL   Creatinine, Urine 865.7     Microalb Creat Ratio 361.7 (*) 0.0 - 30.0 mg/g  LDL CHOLESTEROL, DIRECT      Result Value Range   Direct LDL 55    POCT GLYCOSYLATED HEMOGLOBIN (HGB A1C)      Result Value Range   Hemoglobin A1C 9.2    POCT URINE PREGNANCY      Result Value Range   Preg Test, Ur Negative         Objective:   Physical Exam  Constitutional: She is oriented to person, place, and time. She appears well-developed and well-nourished. No distress.  HENT:  Head: Normocephalic.  Eyes: Conjunctivae are normal.  Pupils are equal, round, and reactive to light. No scleral icterus.  Neck: Normal range of motion. Neck supple. No thyromegaly present.  Cardiovascular: Normal rate and regular rhythm.  Exam reveals no gallop and no friction rub.   No murmur heard. Pulmonary/Chest: Effort normal and breath sounds normal. No respiratory distress. She has no wheezes. She has no rales.  Abdominal: Soft. Bowel sounds are normal. She exhibits no distension. There is no tenderness. There is no rebound.  Musculoskeletal: Normal range of motion. She exhibits tenderness.  Tenderness to palpation over parascapular muscles on the right Strength 5/5 Reflux 2 plus  Neurological: She is alert and oriented to person, place, and time.  Skin: Skin is warm and dry. No rash noted.  Psychiatric: She has a normal mood and affect. Her behavior is normal.  [facility] reading (PRIMARY) by  Dr. Cleta Alberts C-spine film shows minimal degenerative changes. C-spine films were normal. Chest x-ray shows borderline increased heart size with clear lung field          Assessment & Plan:  Blood pressure is not under control. I increased her cord 25 mg twice a day. Referral was made to cardiology for evaluation. She is  also scheduled for sleep study because of her poorly controlled blood pressure and snoring at night. We were about possible renal artery issues do to her difficulty getting the blood pressure controlled. She was placed on a muscle relaxant

## 2013-02-19 ENCOUNTER — Other Ambulatory Visit: Payer: Self-pay | Admitting: Emergency Medicine

## 2013-02-19 DIAGNOSIS — R9389 Abnormal findings on diagnostic imaging of other specified body structures: Secondary | ICD-10-CM

## 2013-02-21 ENCOUNTER — Telehealth: Payer: Self-pay

## 2013-02-21 NOTE — Telephone Encounter (Signed)
Dr Cleta Alberts advised me that he wants pt to have the CT Chest done w/out contrast due to her DM. I have called [facility] back and advised them of Dr Ellis Parents instr's.

## 2013-02-21 NOTE — Telephone Encounter (Signed)
[facility] called reporting that pt has appt sch tomorrow for a CT Chest w/o contrast, but that the radiologist had recommended on CXR report that a CT Chest w/Contrast be performed. Dr Cleta Alberts, do you want the CT to be done WITH contrast? If so, I can change order if you'd like.

## 2013-02-22 ENCOUNTER — Ambulatory Visit
Admission: RE | Admit: 2013-02-22 | Discharge: 2013-02-22 | Disposition: A | Discharge status: Home / Self Care | Payer: Self-pay | Source: Ambulatory Visit | Attending: Emergency Medicine | Admitting: Emergency Medicine

## 2013-02-22 DIAGNOSIS — R9389 Abnormal findings on diagnostic imaging of other specified body structures: Secondary | ICD-10-CM

## 2013-02-24 ENCOUNTER — Encounter: Payer: Self-pay | Admitting: Neurology

## 2013-02-24 ENCOUNTER — Ambulatory Visit (INDEPENDENT_AMBULATORY_CARE_PROVIDER_SITE_OTHER): Payer: Federal, State, Local not specified - PPO | Admitting: Neurology

## 2013-02-24 VITALS — BP 210/120 | HR 76 | Ht <= 58 in | Wt 138.0 lb

## 2013-02-24 DIAGNOSIS — R0989 Other specified symptoms and signs involving the circulatory and respiratory systems: Secondary | ICD-10-CM

## 2013-02-24 DIAGNOSIS — R0683 Snoring: Secondary | ICD-10-CM

## 2013-02-24 DIAGNOSIS — R0609 Other forms of dyspnea: Secondary | ICD-10-CM

## 2013-02-24 DIAGNOSIS — R351 Nocturia: Secondary | ICD-10-CM

## 2013-02-24 DIAGNOSIS — N951 Menopausal and female climacteric states: Secondary | ICD-10-CM

## 2013-02-24 DIAGNOSIS — G4710 Hypersomnia, unspecified: Secondary | ICD-10-CM

## 2013-02-24 HISTORY — DX: Snoring: R06.83

## 2013-02-24 HISTORY — DX: Hypersomnia, unspecified: G47.10

## 2013-02-24 NOTE — Progress Notes (Signed)
[facility]   Provider:  Melvyn Novas, [place] D  Referring Provider: Collene Gobble, MD Primary Care Physician:  Tally Due, MD  Chief Complaint  Patient presents with  . sleep consult    HPI:  Nancy Evans is a 52 y.o. female  Is seen here as a referral/ revisit  from Dr. Cleta Alberts for a sleep consultation .  Dear Dr. Cleta Alberts,  Thank you  very much for referring this pleasant female patient to my clinic.    As you know , Nancy Evans  is a 52 year old female right-handed, originally from the [place] She in the [place]. She states that she and the rest of her biological family although suffer from excessive daytime sleepiness. 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 She and daughter today of fatigue severity score of 32 points, and an Epworth sleepiness score of 18 points.  Nancy Evans  states that both, she and her husband , are reportedly snoring, but she is unaware of her husband ever witnessed any apneas. 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 3 Pm to 11.30 PM and home at 1 AM. , but now has a daytime job.  She states that her bedtime is around 10:30 and 11 PM it is irregular. She reports that she may stay asleep only for 90 minutes or so before she wakes up for the first time and that her sleep nocturnally is fragmented and multiple arousals. He does not recall any dreams. Her husband also has not reported any observation as to parasomnia acting out dreams etc. She wakes up in the morning between 4 and 5:00 spontaneously without an alarm, and begins her at 6:30 in the morning. The patient's husband reports that she falls asleep within 5 minutes whenever sedentary, sitting down lying down etc. Lately she has craved to nap in the afternoon after work. Time it is concluded at 3 PM, she will drink one or 2 cups of coffee she also drinks iced tea in the afternoon to  evening. She keeps physically active so as to not fall asleep. Sleepiness has been a problem when driving also she did not have any related accidents. She noticed that when she works all the time and comes home later than usual,  driving becomes more difficult because of sleepiness. She feels that her degree of sleepiness has increased over the last 2 or 3 years.  There is a family history of sleep apnea and her brother and she remembers that paternal grandfather would fall asleep very quickly even in the midst of a conversation. He has never been diagnosed or worked up for a sleep disorder. The patient has no personal history of any facial or upper airway trauma, nor surgeries to the Knowles jaw pharynx or larynx. There has been no neck surgery either. She does take a muscle relaxer and form of Flexeril but has restricted as to night time only due to sedation. She noticed that Benadryl will put her to sleep right away but flexible seems not to have Is intact. She was only prescribed this medication during her recent visit on 02/18/2013 with Dr Cleta Alberts.   She actually lost some weight (7  Pounds over 4 month ) without dieting. She attributes this to nausea.    The sleepiness score is highly elevated above normal. She does have some past medical history as you  know she has a history of diabetes mellitus had some microalbuminuria in the past but has normal kidney function, liver function and electrolyte levels. She has asthma , endorsed HTN and Migraines.   She is currently treated with Glucophage, Zocor, Hyzaar, Flexeril, Corag, takes a daily baby aspirin. She also takes Nasonex and Allegra for allergic rhinitis.      Review of Systems: Out of a complete 14 system review, the patient complains of only the following symptoms, and all other reviewed systems are negative.  The patient and was blurred vision some weight loss due to nausea, fatigue, coughing rhinitis, incontinence, constipation, no,  birthmarks, feeling hot at night, some memory impairment, snoring headaches decreased appetite and decreased energy.   History   Social History  . Marital Status: Married    Spouse Name: N/A    Number of Children: 2  . Years of Education: 12   Occupational History  .  [place] [employer]   Social History Main Topics  . Smoking status: Never Smoker   . Smokeless tobacco: Not on file  . Alcohol Use: No  . Drug Use: No  . Sexual Activity: Not on file   Other Topics Concern  . Not on file   Social History Narrative  . No narrative on file    Family History  Problem Relation Age of Onset  . Kidney disease Mother   . Heart disease Father   . Heart disease Sister     Past Medical History  Diagnosis Date  . Hypertension   . Hyperlipidemia   . Allergy   . Diabetes mellitus     No past surgical history on file.  Current Outpatient Prescriptions  Medication Sig Dispense Refill  . aspirin 81 MG tablet Take 81 mg by mouth daily.      . carvedilol (COREG) 25 MG tablet Take one tablet twice a day  60 tablet  11  . fexofenadine (ALLEGRA) 180 MG tablet Take 1 tablet (180 mg total) by mouth daily.  90 tablet  3  . losartan-hydrochlorothiazide (HYZAAR) 100-25 MG per tablet Take 1 tablet by mouth daily.  90 tablet  2  . metFORMIN (GLUCOPHAGE) 1000 MG tablet Take 1 tablet (1,000 mg total) by mouth 2 (two) times daily with a meal.  180 tablet  2  . mometasone (NASONEX) 50 MCG/ACT nasal spray Place 2 sprays into the nose as needed.  17 g  11  . simvastatin (ZOCOR) 20 MG tablet Take 1 tablet (20 mg total) by mouth every evening.  90 tablet  3  . simvastatin (ZOCOR) 20 MG tablet Take 1 tablet (20 mg total) by mouth at bedtime.  90 tablet  2  . cyclobenzaprine (FLEXERIL) 5 MG tablet 1 pill by mouth up to every 8 hours as needed. Start with one pill by mouth each bedtime as needed due to sedation  15 tablet  0   No current facility-administered medications for this visit.    Allergies as  of 02/24/2013 - Review Complete 02/24/2013  Allergen Reaction Noted  . Lisinopril Swelling 12/11/2011    Vitals: BP 210/120  Pulse 76  Ht 4\' 9"  (1.448 m)  Wt 138 lb (62.596 kg)  BMI 29.85 kg/m2 Last Weight:  Wt Readings from Last 1 Encounters:  02/24/13 138 lb (62.596 kg)   Last Height:   Ht Readings from Last 1 Encounters:  02/24/13 4\' 9"  (1.448 m)   BMI  .  Physical exam:  General: The patient is awake, alert  and appears not in acute distress. The patient is well groomed. Head: Normocephalic, atraumatic. Neck is supple. Mallampati 3 , neck circumference: 14, inches, no nasal deviation but  flat shaped  Nostrils , restricted breathing through the nose ( rhinitis), no retrognathia.  Cardiovascular:  Regular rate and rhythm  without  murmurs or carotid bruit, and without distended neck veins. Respiratory: Lungs are clear to auscultation. Skin:  Without evidence of edema, or rash Trunk: BMI is elevated ,  has normal posture.  Neurologic exam : The patient is awake and alert, oriented to place and time.  Memory subjective  described as intact. There is a normal attention span & concentration ability. Speech is fluent without dysarthria, dysphonia or aphasia. Mood and affect are appropriate.  Cranial nerves: Pupils are equal and briskly reactive to light. Funduscopic exam without  evidence of pallor or edema. Extraocular movements  in vertical and horizontal planes intact and without nystagmus. Visual fields by finger perimetry are intact. Hearing to finger rub intact.  Facial sensation intact to fine touch. Facial motor strength is symmetric and tongue and uvula move midline.  Motor exam:   Normal tone and normal muscle bulk and symmetric normal strength in all extremities.  Sensory:  Fine touch, pinprick and vibration were tested in all extremities. Proprioception is normal.  Coordination: Rapid alternating movements in the fingers/hands is tested and normal. Finger-to-nose  maneuver tested and normal without evidence of ataxia, dysmetria or tremor.  Gait and station: Patient walks without assistive device . Strength within normal limits. Stance is stable and normal. Tandem gait is  unfragmented. Romberg testing is normal.  Deep tendon reflexes: in the  upper and lower extremities are symmetric and intact. Babinski maneuver response is   downgoing.   Assessment:  After physical and neurologic examination, review of laboratory studies, imaging, neurophysiology testing and pre-existing records, assessment is   That of a patient with high risk for obstructive sleep apnea, based on metabolic syndrome, BMI and rhinitis , a habitual mouth breather with witnessed snoring, nocturia, , fragmented sleep and EDS Epworth 18.  She has a strong family history for hypersomnia, but no associated symptoms of narcolepsy or cataplexy.   Plan:  Treatment plan and additional workup :  Split PSG - SPLIT at 15 , score at 3% and allow for a FFM , incase if nasal device is poorly tolerated. Patient to bring her Nasonex.   This visit was 45 minutes in duration with medical   Information, patient education and OSA about a risk factor,  25 minutes.  BMI reduction .  Positional changes in sleep affecting apnea and snoring.

## 2013-02-24 NOTE — Patient Instructions (Signed)
Sleep Apnea  Sleep apnea is a sleep disorder characterized by abnormal pauses in breathing while you sleep. When your breathing pauses, the level of oxygen in your blood decreases. This causes you to move out of deep sleep and into light sleep. As a result, your quality of sleep is poor, and the system that carries your blood throughout your body (cardiovascular system) experiences stress. If sleep apnea remains untreated, the following conditions can develop:  High blood pressure (hypertension).  Coronary artery disease.  Inability to achieve or maintain an erection (impotence).  Impairment of your thought process (cognitive dysfunction). There are three types of sleep apnea: 1. Obstructive sleep apnea Pauses in breathing during sleep because of a blocked airway. 2. Central sleep apnea Pauses in breathing during sleep because the area of the brain that controls your breathing does not send the correct signals to the muscles that control breathing. 3. Mixed sleep apnea A combination of both obstructive and central sleep apnea. RISK FACTORS The following risk factors can increase your risk of developing sleep apnea:  Being overweight.  Smoking.  Having narrow passages in your nose and throat.  Being of older age.  Being female.  Alcohol use.  Sedative and tranquilizer use.  Ethnicity. Among individuals younger than 35 years, African Americans are at increased risk of sleep apnea. SYMPTOMS   Difficulty staying asleep.  Daytime sleepiness and fatigue.  Loss of energy.  Irritability.  Loud, heavy snoring.  Morning headaches.  Trouble concentrating.  Forgetfulness.  Decreased interest in sex. DIAGNOSIS  In order to diagnose sleep apnea, your caregiver will perform a physical examination. Your caregiver may suggest that you take a home sleep test. Your caregiver may also recommend that you spend the night in a sleep lab. In the sleep lab, several monitors record  information about your heart, lungs, and brain while you sleep. Your leg and arm movements and blood oxygen level are also recorded. TREATMENT The following actions may help to resolve mild sleep apnea:  Sleeping on your side.   Using a decongestant if you have nasal congestion.   Avoiding the use of depressants, including alcohol, sedatives, and narcotics.   Losing weight and modifying your diet if you are overweight. There also are devices and treatments to help open your airway:  Oral appliances. These are custom-made mouthpieces that shift your lower jaw forward and slightly open your bite. This opens your airway.  Devices that create positive airway pressure. This positive pressure "splints" your airway open to help you breathe better during sleep. The following devices create positive airway pressure:  Continuous positive airway pressure (CPAP) device. The CPAP device creates a continuous level of air pressure with an air pump. The air is delivered to your airway through a mask while you sleep. This continuous pressure keeps your airway open.  Nasal expiratory positive airway pressure (EPAP) device. The EPAP device creates positive air pressure as you exhale. The device consists of single-use valves, which are inserted into each nostril and held in place by adhesive. The valves create very little resistance when you inhale but create much more resistance when you exhale. That increased resistance creates the positive airway pressure. This positive pressure while you exhale keeps your airway open, making it easier to breath when you inhale again.  Bilevel positive airway pressure (BPAP) device. The BPAP device is used mainly in patients with central sleep apnea. This device is similar to the CPAP device because it also uses an air pump to deliver   continuous air pressure through a mask. However, with the BPAP machine, the pressure is set at two different levels. The pressure when you  exhale is lower than the pressure when you inhale.  Surgery. Typically, surgery is only done if you cannot comply with less invasive treatments or if the less invasive treatments do not improve your condition. Surgery involves removing excess tissue in your airway to create a wider passage way. Document Released: 04/17/2002 Document Revised: 10/27/2011 Document Reviewed: 09/03/2011 Exit[employer] Patient Information 2014 [employer]. Polysomnography (Sleep Studies) Polysomnography (PSG) is a series of tests used for detecting (diagnosing) obstructive sleep apnea and other sleep disorders. The tests measure how some parts of your body are working while you are sleeping. The tests are extensive and expensive. They are done in a sleep lab or hospital, and vary from center to center. Your caregiver may perform other more simple sleep studies and questionnaires before doing more complete and involved testing. Testing may not be covered by insurance. Some of these tests are:  An EEG (Electroencephalogram). This tests your brain waves and stages of sleep.  An EOG (Electrooculogram). This measures the movements of your eyes. It detects periods of REM (rapid eye movement) sleep, which is your dream sleep.  An EKG (Electrocardiogram). This measures your heart rhythm.  EMG (Electromyography). This is a measurement of how the muscles are working in your upper airway and your legs while sleeping.  An oximetry measurement. It measures how much oxygen (air) you are getting while sleeping.  Breathing efforts may be measured. The same test can be interpreted (understood) differently by different caregivers and centers that study sleep.  Studies may be given an apnea/hypopnea index (AHI). This is a number which is found by counting the times of no breathing or under breathing during the night, and relating those numbers to the amount of time spent in bed. When the AHI is greater than 15, the patient is likely to  complain of daytime sleepiness. When the AHI is greater than 30, the patient is at increased risk for heart problems and must be followed more closely. Following the AHI also allows you to know how treatment is working. Simple oximetry (tracking the amount of oxygen that is taken in) can be used for screening patients who:  Do not have symptoms (problems) of OSA.  Have a normal Epworth Sleepiness Scale Score.  Have a low pre-test probability of having OSA.  Have none of the upper airway problems likely to cause apnea.  Oximetry is also used to determine if treatment is effective in patients who showed significant desaturations (not getting enough oxygen) on their home sleep study. One extra measure of safety is to perform additional studies for the person who only snores. This is because no one can predict with absolute certainty who will have OSA. Those who show significant desaturations (not getting enough oxygen) are recommended to have a more detailed sleep study. Document Released: 11/01/2002 Document Revised: 07/20/2011 Document Reviewed: 04/27/2005 ExitCare Patient Information 2014 [employer].  

## 2013-03-01 ENCOUNTER — Telehealth: Payer: Self-pay

## 2013-03-01 ENCOUNTER — Other Ambulatory Visit: Payer: Self-pay | Admitting: Radiology

## 2013-03-01 DIAGNOSIS — R911 Solitary pulmonary nodule: Secondary | ICD-10-CM

## 2013-03-01 NOTE — Telephone Encounter (Signed)
Pt's husband called with concern over the nodule found in pt's lung. Pt was in the room also when I spoke to husband. Husband has h/o relative with lung cancer that was not caught early and he is NOT comfortable waiting 6 mos to have repeat CT. Dr Cleta Alberts, can you either call husband to discuss further or advise if there is any other more proactive approach to finding out what this nodule is so that we can call husband back. Pt asks that you/we call husband instead of herself because he has more questions. # is [id number].

## 2013-03-02 NOTE — Telephone Encounter (Signed)
If he is uncomfortable waiting 6 months as recommended by the radiologist I would be happy to refer him to one of the lung specialists and get their opinion regarding the lung nodule. If he is willing go ahead and make the appointment for the patient to see one of the [facility] pulmonary specialist.

## 2013-03-02 NOTE — Telephone Encounter (Signed)
Called patient. Left message for her to call back

## 2013-03-03 NOTE — Telephone Encounter (Signed)
Spoke to her husband, he will discuss with his wife and call me back.

## 2013-03-10 ENCOUNTER — Other Ambulatory Visit: Payer: Self-pay

## 2013-03-16 ENCOUNTER — Ambulatory Visit (INDEPENDENT_AMBULATORY_CARE_PROVIDER_SITE_OTHER): Payer: Federal, State, Local not specified - PPO

## 2013-03-16 DIAGNOSIS — N951 Menopausal and female climacteric states: Secondary | ICD-10-CM

## 2013-03-16 DIAGNOSIS — R351 Nocturia: Secondary | ICD-10-CM

## 2013-03-16 DIAGNOSIS — G4733 Obstructive sleep apnea (adult) (pediatric): Secondary | ICD-10-CM

## 2013-03-16 DIAGNOSIS — R0683 Snoring: Secondary | ICD-10-CM

## 2013-03-16 DIAGNOSIS — G4710 Hypersomnia, unspecified: Secondary | ICD-10-CM

## 2013-03-16 NOTE — Telephone Encounter (Signed)
They will proceed with the CT in 6 months.

## 2013-03-29 ENCOUNTER — Encounter: Payer: Self-pay | Admitting: *Deleted

## 2013-03-29 ENCOUNTER — Telehealth: Payer: Self-pay | Admitting: Neurology

## 2013-03-29 ENCOUNTER — Other Ambulatory Visit: Payer: Self-pay | Admitting: *Deleted

## 2013-03-29 DIAGNOSIS — G4733 Obstructive sleep apnea (adult) (pediatric): Secondary | ICD-10-CM

## 2013-03-29 NOTE — Telephone Encounter (Signed)
I called and left a message for the patient that her recent sleep study revealed severe obstructive sleep apnea and that Dr. Vickey Huger is recommending CPAP Therapy. I informed the patient that I will send her order to [facility] and that I will be mailing her results to her and if she has any questions she can callback and ask to speak with me Jasmine December).

## 2013-04-05 ENCOUNTER — Encounter: Payer: Self-pay | Admitting: Internal Medicine

## 2013-09-04 ENCOUNTER — Other Ambulatory Visit: Payer: Self-pay | Admitting: Family Medicine

## 2013-09-14 ENCOUNTER — Other Ambulatory Visit: Payer: Self-pay | Admitting: Family Medicine

## 2013-09-14 NOTE — Telephone Encounter (Signed)
Needs office visit.

## 2013-09-23 ENCOUNTER — Other Ambulatory Visit: Payer: Self-pay | Admitting: Family Medicine

## 2013-10-16 ENCOUNTER — Other Ambulatory Visit: Payer: Self-pay | Admitting: Family Medicine

## 2013-10-20 ENCOUNTER — Ambulatory Visit (INDEPENDENT_AMBULATORY_CARE_PROVIDER_SITE_OTHER): Payer: Federal, State, Local not specified - PPO | Admitting: Family Medicine

## 2013-10-20 VITALS — BP 138/88 | HR 77 | Temp 98.2°F | Resp 18 | Ht <= 58 in | Wt 130.0 lb

## 2013-10-20 DIAGNOSIS — R7309 Other abnormal glucose: Secondary | ICD-10-CM

## 2013-10-20 DIAGNOSIS — I10 Essential (primary) hypertension: Secondary | ICD-10-CM

## 2013-10-20 DIAGNOSIS — E7800 Pure hypercholesterolemia, unspecified: Secondary | ICD-10-CM

## 2013-10-20 DIAGNOSIS — G4700 Insomnia, unspecified: Secondary | ICD-10-CM

## 2013-10-20 DIAGNOSIS — R739 Hyperglycemia, unspecified: Secondary | ICD-10-CM

## 2013-10-20 LAB — POCT GLYCOSYLATED HEMOGLOBIN (HGB A1C): Hemoglobin A1C: 12.7

## 2013-10-20 MED ORDER — CLONIDINE HCL ER 0.1 MG PO TB12: 0.1000 mg | ORAL_TABLET | Freq: Every day | ORAL | Status: DC

## 2013-10-20 MED ORDER — CARVEDILOL 25 MG PO TABS: ORAL_TABLET | ORAL | Status: DC

## 2013-10-20 MED ORDER — LOSARTAN POTASSIUM-HCTZ 100-25 MG PO TABS: 1.0000 | ORAL_TABLET | Freq: Every day | ORAL | Status: DC

## 2013-10-20 MED ORDER — AMLODIPINE BESYLATE 10 MG PO TABS: 10.0000 mg | ORAL_TABLET | Freq: Every day | ORAL | Status: DC

## 2013-10-20 MED ORDER — METFORMIN HCL 1000 MG PO TABS: 1000.0000 mg | ORAL_TABLET | Freq: Two times a day (BID) | ORAL | Status: DC

## 2013-10-20 MED ORDER — LORAZEPAM 0.5 MG PO TABS: 0.5000 mg | ORAL_TABLET | Freq: Two times a day (BID) | ORAL | Status: DC | PRN

## 2013-10-20 MED ORDER — SIMVASTATIN 20 MG PO TABS: ORAL_TABLET | ORAL | Status: DC

## 2013-10-20 NOTE — Progress Notes (Signed)
Is a 53 year old woman is seen for refills on her hypertension medicine. She notes that she's been under great stress for the post [employer]. He works from 10 to 6:30 and her boss has changed and this is been difficult.   Objective: No acute distress HEENT unremarkable Neck: Supple no adenopathy or thyromegaly Chest: Clear Heart: Regular no murmur Skin: No obvious rashes Exoskeletal: Patient was easily in the room and has a normal gait Extremities: No edema  Assessment:  Stable, the insomnia is new but she can probably manage with a little ativan  Pure hypercholesterolemia - Plan: simvastatin (ZOCOR) 20 MG tablet, Lipid panel  HTN (hypertension) - Plan: losartan-hydrochlorothiazide (HYZAAR) 100-25 MG per tablet, cloNIDine HCl (KAPVAY) 0.1 MG TB12 ER tablet, carvedilol (COREG) 25 MG tablet, amLODipine (NORVASC) 10 MG tablet, Comprehensive metabolic panel  Hypertension - Plan: losartan-hydrochlorothiazide (HYZAAR) 100-25 MG per tablet, cloNIDine HCl (KAPVAY) 0.1 MG TB12 ER tablet, carvedilol (COREG) 25 MG tablet, amLODipine (NORVASC) 10 MG tablet  Insomnia - Plan: LORazepam (ATIVAN) 0.5 MG tablet  Hyperglycemia - Plan: metFORMIN (GLUCOPHAGE) 1000 MG tablet, POCT glycosylated hemoglobin (Hb A1C)  Signed, Elvina SidleKurt Brace Welte, MD

## 2013-10-21 LAB — COMPREHENSIVE METABOLIC PANEL
ALT: 22 U/L (ref 0–35)
AST: 16 U/L (ref 0–37)
Albumin: 4.3 g/dL (ref 3.5–5.2)
Alkaline Phosphatase: 60 U/L (ref 39–117)
BUN: 20 mg/dL (ref 6–23)
CO2: 24 mEq/L (ref 19–32)
Calcium: 9.6 mg/dL (ref 8.4–10.5)
Chloride: 102 mEq/L (ref 96–112)
Creat: 0.98 mg/dL (ref 0.50–1.10)
Glucose, Bld: 314 mg/dL — ABNORMAL HIGH (ref 70–99)
Potassium: 3.7 mEq/L (ref 3.5–5.3)
Sodium: 138 mEq/L (ref 135–145)
Total Bilirubin: 0.6 mg/dL (ref 0.2–1.2)
Total Protein: 7.3 g/dL (ref 6.0–8.3)

## 2013-10-21 LAB — LIPID PANEL
Cholesterol: 112 mg/dL (ref 0–200)
HDL: 42 mg/dL (ref >39)
LDL Cholesterol: 15 mg/dL (ref 0–99)
Total CHOL/HDL Ratio: 2.7 Ratio
Triglycerides: 275 mg/dL — ABNORMAL HIGH (ref <150)
VLDL: 55 mg/dL — ABNORMAL HIGH (ref 0–40)

## 2013-11-19 ENCOUNTER — Other Ambulatory Visit: Payer: Self-pay | Admitting: Family Medicine

## 2013-12-16 ENCOUNTER — Other Ambulatory Visit: Payer: Self-pay | Admitting: Physician Assistant

## 2013-12-17 ENCOUNTER — Other Ambulatory Visit: Payer: Self-pay | Admitting: Radiology

## 2013-12-17 NOTE — Telephone Encounter (Signed)
I sent in a 2 week supply, because it looks like she is due for follow up, does she need to follow up or can you send in a full supply?

## 2013-12-18 MED ORDER — LOSARTAN POTASSIUM-HCTZ 100-25 MG PO TABS: 1.0000 | ORAL_TABLET | Freq: Every day | ORAL | Status: DC

## 2014-01-08 ENCOUNTER — Other Ambulatory Visit: Payer: Self-pay | Admitting: Physician Assistant

## 2014-03-19 ENCOUNTER — Other Ambulatory Visit: Payer: Self-pay | Admitting: Family Medicine

## 2014-03-20 NOTE — Telephone Encounter (Signed)
Faxed

## 2014-05-01 ENCOUNTER — Telehealth: Payer: Self-pay | Admitting: Radiology

## 2014-05-01 NOTE — Telephone Encounter (Signed)
Patient has cancelled CT scan, Dr Cleta Albertsaub would like for her to have this done, or come in to see him and discuss. Called patient left message for her to call me back.

## 2014-05-03 ENCOUNTER — Encounter: Payer: Self-pay | Admitting: Family Medicine

## 2014-05-03 ENCOUNTER — Ambulatory Visit (INDEPENDENT_AMBULATORY_CARE_PROVIDER_SITE_OTHER): Payer: Federal, State, Local not specified - PPO | Admitting: Family Medicine

## 2014-05-03 VITALS — BP 139/87 | HR 65 | Temp 98.1°F | Resp 16 | Ht 58.5 in | Wt 132.2 lb

## 2014-05-03 DIAGNOSIS — R05 Cough: Secondary | ICD-10-CM

## 2014-05-03 DIAGNOSIS — R739 Hyperglycemia, unspecified: Secondary | ICD-10-CM

## 2014-05-03 DIAGNOSIS — R52 Pain, unspecified: Secondary | ICD-10-CM

## 2014-05-03 DIAGNOSIS — I10 Essential (primary) hypertension: Secondary | ICD-10-CM

## 2014-05-03 DIAGNOSIS — J309 Allergic rhinitis, unspecified: Secondary | ICD-10-CM

## 2014-05-03 DIAGNOSIS — R059 Cough, unspecified: Secondary | ICD-10-CM

## 2014-05-03 DIAGNOSIS — E119 Type 2 diabetes mellitus without complications: Secondary | ICD-10-CM

## 2014-05-03 DIAGNOSIS — J00 Acute nasopharyngitis [common cold]: Secondary | ICD-10-CM

## 2014-05-03 LAB — POCT GLYCOSYLATED HEMOGLOBIN (HGB A1C): Hemoglobin A1C: 11.7

## 2014-05-03 LAB — POCT CBC
GRANULOCYTE PERCENT: 64.3 % (ref 37–80)
HCT, POC: 41 % (ref 37.7–47.9)
Hemoglobin: 13.4 g/dL (ref 12.2–16.2)
Lymph, poc: 1.2 (ref 0.6–3.4)
MCH: 28.2 pg (ref 27–31.2)
MCHC: 32.8 g/dL (ref 31.8–35.4)
MCV: 85.8 fL (ref 80–97)
MID (cbc): 0.6 (ref 0–0.9)
MPV: 6.9 fL (ref 0–99.8)
POC Granulocyte: 3.3 (ref 2–6.9)
POC LYMPH PERCENT: 23.4 %L (ref 10–50)
POC MID %: 12.3 % — AB (ref 0–12)
Platelet Count, POC: 222 10*3/uL (ref 142–424)
RBC: 4.77 M/uL (ref 4.04–5.48)
RDW, POC: 13.2 %
WBC: 5.2 10*3/uL (ref 4.6–10.2)

## 2014-05-03 LAB — COMPREHENSIVE METABOLIC PANEL
ALT: 17 U/L (ref 0–35)
AST: 17 U/L (ref 0–37)
Albumin: 4.3 g/dL (ref 3.5–5.2)
Alkaline Phosphatase: 65 U/L (ref 39–117)
BILIRUBIN TOTAL: 0.8 mg/dL (ref 0.2–1.2)
BUN: 13 mg/dL (ref 6–23)
CALCIUM: 9.3 mg/dL (ref 8.4–10.5)
CHLORIDE: 101 meq/L (ref 96–112)
CO2: 24 mEq/L (ref 19–32)
CREATININE: 0.81 mg/dL (ref 0.50–1.10)
Glucose, Bld: 219 mg/dL — ABNORMAL HIGH (ref 70–99)
Potassium: 3.5 mEq/L (ref 3.5–5.3)
Sodium: 135 mEq/L (ref 135–145)
Total Protein: 7.6 g/dL (ref 6.0–8.3)

## 2014-05-03 LAB — GLUCOSE, POCT (MANUAL RESULT ENTRY): POC Glucose: 240 mg/dl — AB (ref 70–99)

## 2014-05-03 MED ORDER — IPRATROPIUM BROMIDE 0.03 % NA SOLN
2.0000 | Freq: Four times a day (QID) | NASAL | Status: DC
Start: 2014-05-03 — End: 2015-02-22

## 2014-05-03 MED ORDER — MOMETASONE FUROATE 50 MCG/ACT NA SUSP: 2.0000 | NASAL | Status: DC | PRN

## 2014-05-03 MED ORDER — SITAGLIPTIN PHOSPHATE 100 MG PO TABS: 100.0000 mg | ORAL_TABLET | Freq: Every day | ORAL | Status: DC

## 2014-05-03 NOTE — Patient Instructions (Addendum)
Drink plenty of fluids  Get enough rest  Use the Atrovent nasal spray 2 sprays each nostril 4 times daily as necessary  Be very compliant with your diabetic and blood pressure medication  Continue your other regular medications for blood pressure and diabetes  Add Januvia 1 daily for diabetes  Return in 3 months. If you wish to see me you can call and say plan will Dr. Alwyn RenHopper be in and try and come in on a day that I will be in the office.

## 2014-05-03 NOTE — Progress Notes (Signed)
Subjective: Patient is here with a bad cold for a few days. Nasal stuffiness, postnasal drainage, some cough and some sore throat. She is a diabetic and his has not been totally compliant. She does not see a doctor regularly. She takes her diabetes medicines and blood pressure medicines but forgets them often. No other major complaints. No chest pains or shortness of breath.  Objective: Constant sniffling. Her TMs are normal. Nose congested. Throat clear. Neck supple without nodes. Chest clear. Heart regular without murmurs. No ankle edema.  Assessment: Upper respiratory infection (common cold) Diabetes poorly controlled Hypertension, fair control  Plan: CBC, A1c Results for orders placed or performed in visit on 05/03/14  POCT CBC  Result Value Ref Range   WBC 5.2 4.6 - 10.2 K/uL   Lymph, poc 1.2 0.6 - 3.4   POC LYMPH PERCENT 23.4 10 - 50 %L   MID (cbc) 0.6 0 - 0.9   POC MID % 12.3 (A) 0 - 12 %M   POC Granulocyte 3.3 2 - 6.9   Granulocyte percent 64.3 37 - 80 %G   RBC 4.77 4.04 - 5.48 M/uL   Hemoglobin 13.4 12.2 - 16.2 g/dL   HCT, POC 16.141.0 09.637.7 - 47.9 %   MCV 85.8 80 - 97 fL   MCH, POC 28.2 27 - 31.2 pg   MCHC 32.8 31.8 - 35.4 g/dL   RDW, POC 04.513.2 %   Platelet Count, POC 222 142 - 424 K/uL   MPV 6.9 0 - 99.8 fL  POCT glucose (manual entry)  Result Value Ref Range   POC Glucose 240 (A) 70 - 99 mg/dl  POCT glycosylated hemoglobin (Hb A1C)  Result Value Ref Range   Hemoglobin A1C 11.7    Talk to her about the need for being compliant and monitor your sugars. Added Januvia. Treat the nose with Atrovent. Return if worse. Otherwise see her back in 3 months. Very important for her to start taking diabetic treatment seriously.

## 2014-05-05 ENCOUNTER — Other Ambulatory Visit: Payer: Self-pay | Admitting: Family Medicine

## 2014-05-07 ENCOUNTER — Other Ambulatory Visit: Payer: Self-pay | Admitting: Family Medicine

## 2014-05-07 ENCOUNTER — Encounter: Payer: Self-pay | Admitting: Radiology

## 2014-05-08 NOTE — Telephone Encounter (Signed)
I am out of town until next week.  Please refill x 1 month

## 2014-05-09 NOTE — Telephone Encounter (Signed)
Patient states sitaGLIPtin (JANUVIA) 100 MG tablet  is $145 and she would like something less expensive.    [facility] [address] [place]   [phone]

## 2014-05-09 NOTE — Telephone Encounter (Signed)
Previous action noted

## 2014-05-09 NOTE — Telephone Encounter (Signed)
Dr Alwyn RenHopper, is there a less expensive med you would want to change pt to?

## 2014-09-07 IMAGING — CT CT CHEST W/O CM
3 of 4 series · 15 of 30 positions shown, 16 images · non-contrast
Comparison: chest radiograph from 03/14/2009

CLINICAL DATA: Evaluate lung nodules

EXAM:
CT CHEST WITHOUT CONTRAST
TECHNIQUE: Multidetector CT imaging of the chest was performed following the
standard protocol without IV contrast.

[Series 3: chest w/o · axial · non-contrast · 0.76mm/px · z∈[-154,-24]mm · 3 of 53 slices shown]
[im 14/53  lung]
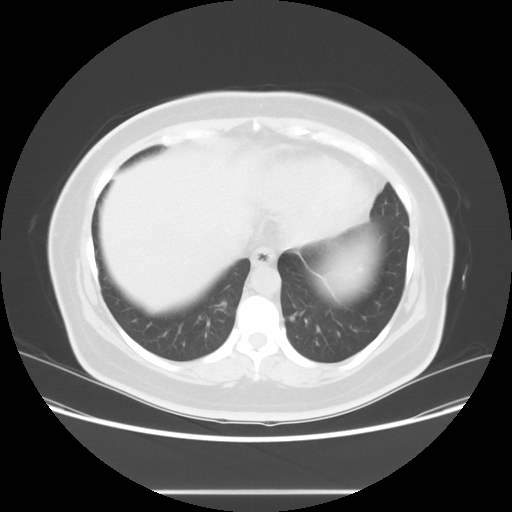
[im 27/53  lung]
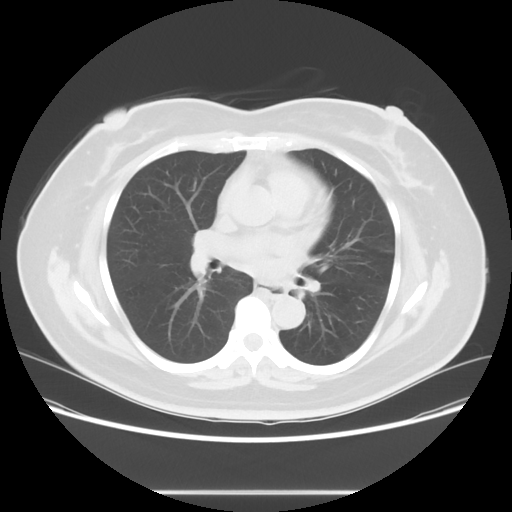
[im 40/53  lung]
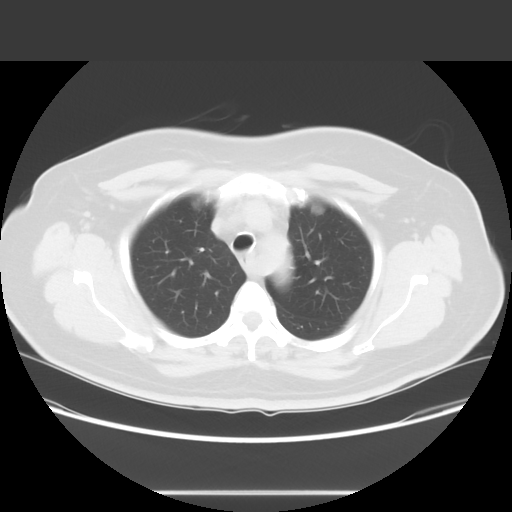

[Series 4: lung windows · axial · 0.76mm/px · z∈[-168,-14]mm · 4 of 53 slices shown, 5 images]
[im 11/53  mediastinal]
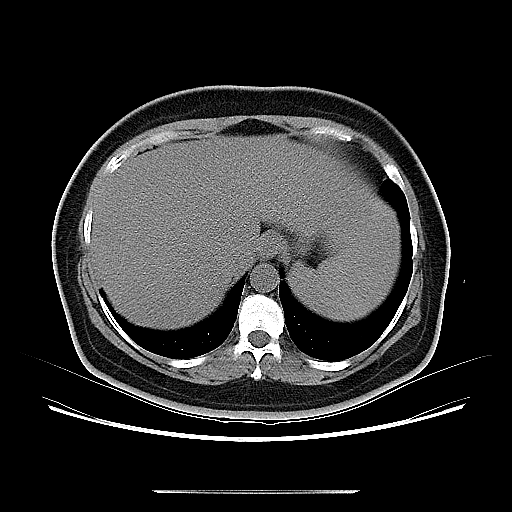
[im 11/53  lung]
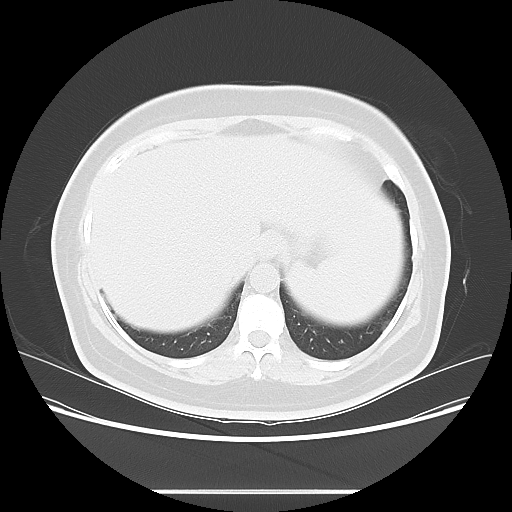
[im 21/53  lung]
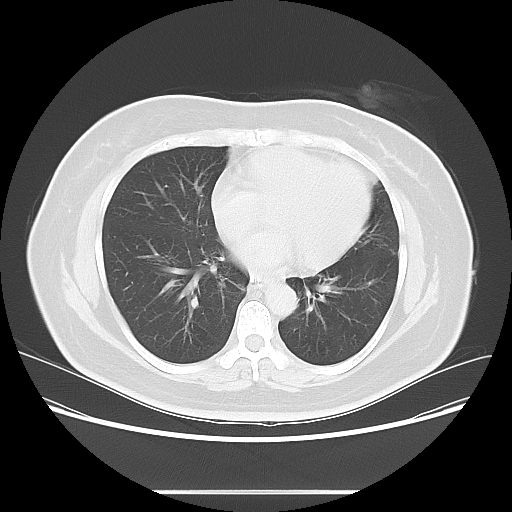
[im 32/53  lung]
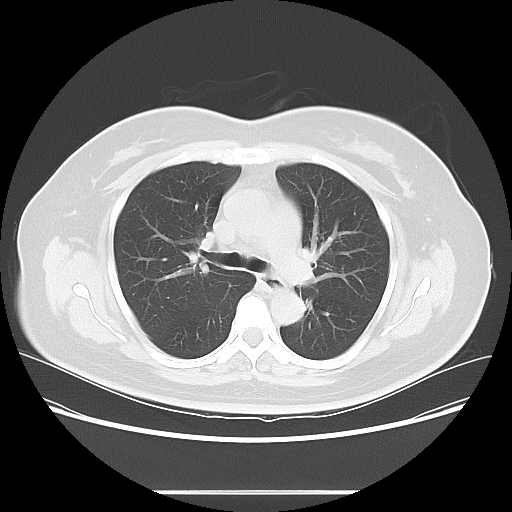
[im 42/53  lung]
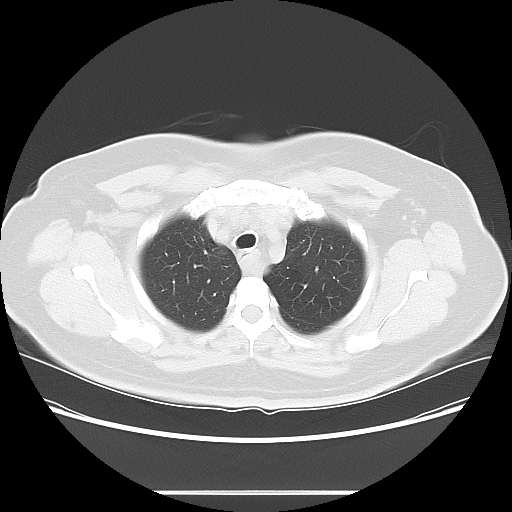

[Series 602: sagittal body · sagittal · 0.76mm/px · 8 of 156 slices shown]
[im 10/156  mediastinal]
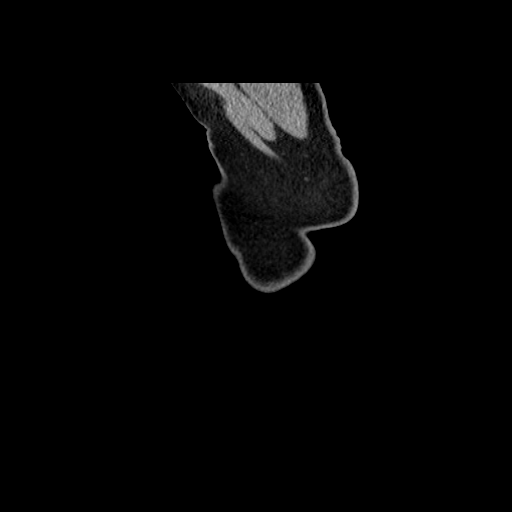
[im 30/156  mediastinal]
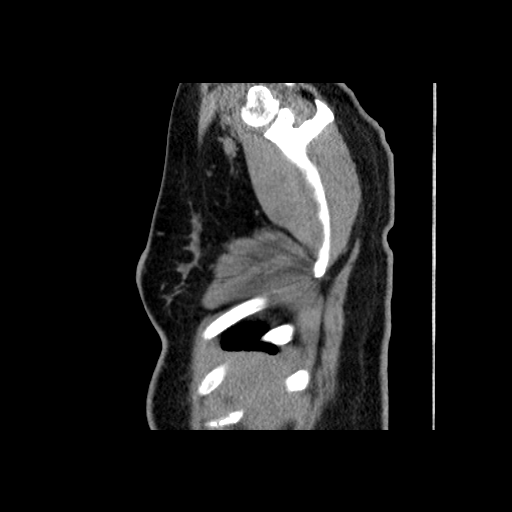
[im 49/156  mediastinal]
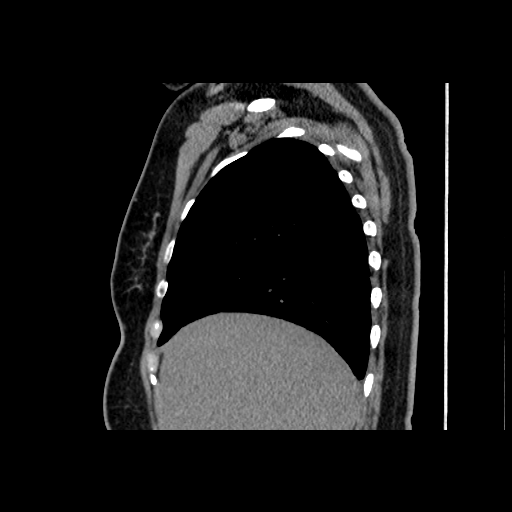
[im 68/156  mediastinal]
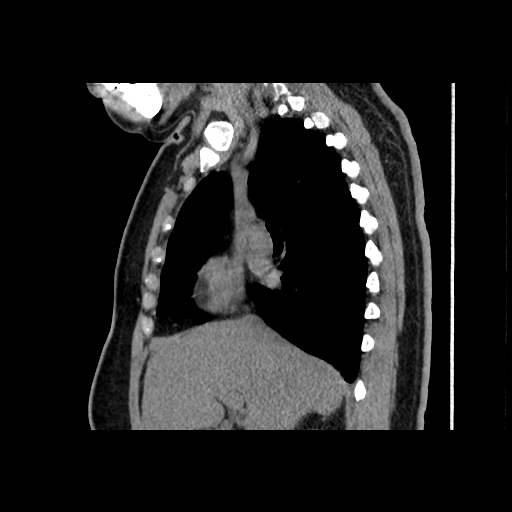
[im 88/156  mediastinal]
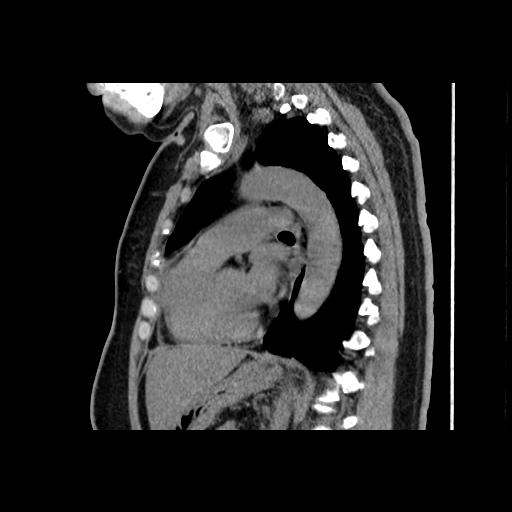
[im 107/156  mediastinal]
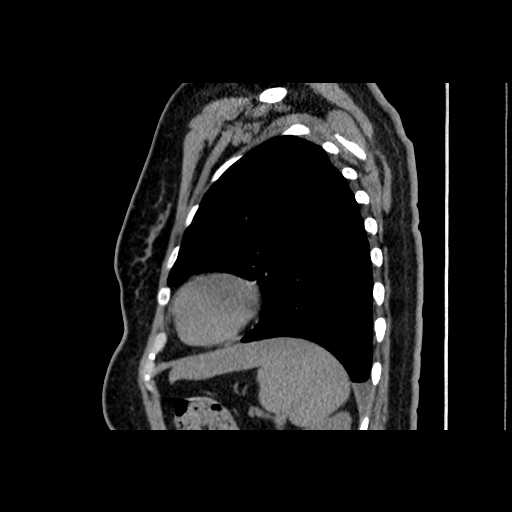
[im 126/156  mediastinal]
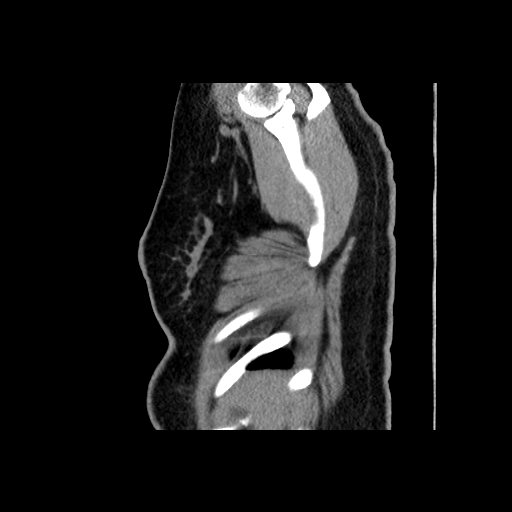
[im 146/156  mediastinal]
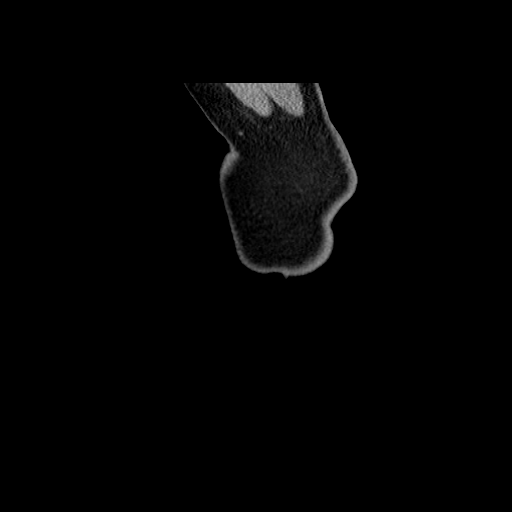

[15 of 30 positions shown; findings below may reference images not displayed]

FINDINGS: There is no pleural effusion identified. No airspace consolidation
identified. 6 mm nodule is identified within the right lower lobe,
image 39/series 4.

The trachea appears patent and is midline. Heart size is normal. No
pericardial effusion. Aberrant right subclavian artery courses
posterior to the esophagus. Heart size is normal. No pericardial
effusion. No mediastinal or hilar adenopathy identified. There is no
axillary or supraclavicular adenopathy.

Review of the visualized osseous structures is on unremarkable. No
aggressive lytic or sclerotic bone lesions identified.

Limited imaging through the upper abdomen shows no aggressive lytic
or sclerotic bone lesions.
IMPRESSION: 1. Right lower lobe nodule measures 6 mm. If the patient is at high
risk for bronchogenic carcinoma, follow-up chest CT at 6-12 months
is recommended. If the patient is at low risk for bronchogenic
carcinoma, follow-up chest CT at 12 months is recommended. This
recommendation follows the consensus statement: Guidelines for
Management of Small Pulmonary Nodules Detected on CT Scans: A
Statement from the [HOSPITAL] as published in Radiology

## 2014-10-15 ENCOUNTER — Encounter: Payer: Self-pay | Admitting: *Deleted

## 2014-10-17 ENCOUNTER — Other Ambulatory Visit: Payer: Self-pay | Admitting: Family Medicine

## 2014-10-19 ENCOUNTER — Encounter (HOSPITAL_COMMUNITY): Payer: Self-pay | Admitting: *Deleted

## 2014-10-19 ENCOUNTER — Emergency Department (HOSPITAL_COMMUNITY)
Admission: EM | Admit: 2014-10-19 | Discharge: 2014-10-19 | Disposition: A | Discharge status: Home / Self Care | Payer: Federal, State, Local not specified - PPO | Attending: Emergency Medicine | Admitting: Emergency Medicine

## 2014-10-19 ENCOUNTER — Ambulatory Visit (INDEPENDENT_AMBULATORY_CARE_PROVIDER_SITE_OTHER): Payer: Federal, State, Local not specified - PPO | Admitting: Emergency Medicine

## 2014-10-19 VITALS — BP 94/60 | HR 79 | Temp 98.7°F | Resp 16

## 2014-10-19 DIAGNOSIS — L509 Urticaria, unspecified: Secondary | ICD-10-CM | POA: Diagnosis not present

## 2014-10-19 DIAGNOSIS — L299 Pruritus, unspecified: Secondary | ICD-10-CM | POA: Diagnosis present

## 2014-10-19 DIAGNOSIS — Y939 Activity, unspecified: Secondary | ICD-10-CM | POA: Insufficient documentation

## 2014-10-19 DIAGNOSIS — E785 Hyperlipidemia, unspecified: Secondary | ICD-10-CM | POA: Diagnosis not present

## 2014-10-19 DIAGNOSIS — E1165 Type 2 diabetes mellitus with hyperglycemia: Secondary | ICD-10-CM

## 2014-10-19 DIAGNOSIS — Z7982 Long term (current) use of aspirin: Secondary | ICD-10-CM | POA: Diagnosis not present

## 2014-10-19 DIAGNOSIS — T7840XA Allergy, unspecified, initial encounter: Secondary | ICD-10-CM

## 2014-10-19 DIAGNOSIS — Y999 Unspecified external cause status: Secondary | ICD-10-CM | POA: Diagnosis not present

## 2014-10-19 DIAGNOSIS — F329 Major depressive disorder, single episode, unspecified: Secondary | ICD-10-CM | POA: Diagnosis not present

## 2014-10-19 DIAGNOSIS — I10 Essential (primary) hypertension: Secondary | ICD-10-CM | POA: Insufficient documentation

## 2014-10-19 DIAGNOSIS — T782XXA Anaphylactic shock, unspecified, initial encounter: Secondary | ICD-10-CM | POA: Diagnosis not present

## 2014-10-19 DIAGNOSIS — T781XXA Other adverse food reactions, not elsewhere classified, initial encounter: Secondary | ICD-10-CM | POA: Diagnosis not present

## 2014-10-19 DIAGNOSIS — Y929 Unspecified place or not applicable: Secondary | ICD-10-CM | POA: Insufficient documentation

## 2014-10-19 DIAGNOSIS — F32A Depression, unspecified: Secondary | ICD-10-CM

## 2014-10-19 DIAGNOSIS — Z79899 Other long term (current) drug therapy: Secondary | ICD-10-CM | POA: Insufficient documentation

## 2014-10-19 DIAGNOSIS — E119 Type 2 diabetes mellitus without complications: Secondary | ICD-10-CM | POA: Insufficient documentation

## 2014-10-19 DIAGNOSIS — X58XXXA Exposure to other specified factors, initial encounter: Secondary | ICD-10-CM | POA: Diagnosis not present

## 2014-10-19 DIAGNOSIS — IMO0002 Reserved for concepts with insufficient information to code with codable children: Secondary | ICD-10-CM

## 2014-10-19 LAB — GLUCOSE, POCT (MANUAL RESULT ENTRY): POC Glucose: 395 mg/dl — AB (ref 70–99)

## 2014-10-19 MED ORDER — SODIUM CHLORIDE 0.9 % IV BOLUS (SEPSIS)
1000.0000 mL | Freq: Once | INTRAVENOUS | Status: AC
  Administered 2014-10-19: 1000 mL via INTRAVENOUS

## 2014-10-19 MED ORDER — PREDNISONE 20 MG PO TABS: 40.0000 mg | ORAL_TABLET | Freq: Every day | ORAL | Status: DC

## 2014-10-19 MED ORDER — RANITIDINE HCL 150 MG PO TABS
300.0000 mg | ORAL_TABLET | Freq: Once | ORAL | Status: AC
  Administered 2014-10-19: 300 mg via ORAL

## 2014-10-19 MED ORDER — DIPHENHYDRAMINE HCL 50 MG/ML IJ SOLN
25.0000 mg | Freq: Once | INTRAMUSCULAR | Status: AC
  Administered 2014-10-19: 25 mg via INTRAVENOUS
  Filled 2014-10-19: qty 1

## 2014-10-19 MED ORDER — CETIRIZINE HCL 10 MG PO TABS
10.0000 mg | ORAL_TABLET | Freq: Once | ORAL | Status: AC
  Administered 2014-10-19: 10 mg via ORAL

## 2014-10-19 MED ORDER — CETIRIZINE HCL 5 MG PO TABS: 10.0000 mg | ORAL_TABLET | Freq: Every day | ORAL | Status: DC

## 2014-10-19 MED ORDER — DIPHENHYDRAMINE HCL 50 MG/ML IJ SOLN
25.0000 mg | Freq: Once | INTRAMUSCULAR | Status: AC
  Administered 2014-10-19: 25 mg via INTRAMUSCULAR

## 2014-10-19 MED ORDER — METHYLPREDNISOLONE SODIUM SUCC 125 MG IJ SOLR
125.0000 mg | Freq: Once | INTRAMUSCULAR | Status: AC
  Administered 2014-10-19: 125 mg via INTRAVENOUS
  Filled 2014-10-19: qty 2

## 2014-10-19 MED ORDER — FAMOTIDINE IN NACL 20-0.9 MG/50ML-% IV SOLN
20.0000 mg | INTRAVENOUS | Status: AC
  Administered 2014-10-19: 20 mg via INTRAVENOUS
  Filled 2014-10-19: qty 50

## 2014-10-19 MED ORDER — RANITIDINE HCL 150 MG PO TABS: 150.0000 mg | ORAL_TABLET | Freq: Two times a day (BID) | ORAL | Status: DC

## 2014-10-19 NOTE — ED Notes (Addendum)
Pt reports allergic reaction to shrimp that she ate last night around 2100.  Onset of sxs is today at 1500.  Reports bila eye swelling and hives all over her body with itching.  Went to see her PCP and was sent here.  Pt denies any resp sxs at this time.

## 2014-10-19 NOTE — Progress Notes (Addendum)
° °  Subjective:    Patient ID: Nancy Evans, female    DOB: Dec 19, 1960, 54 y.o.   MRN: [id number] This chart was scribed for Nancy Chris, MD by Littie Deeds, Medical Scribe. This patient was seen in room 14 and the patient's care was started at 5:29 PM.   HPI HPI Comments: [name] is a 54 y.o. female with a hx of hypertension who presents to the [facility] complaining of gradual onset, mildly pruitic rashes throughout her body that started about 2 hours ago. She has not taken anything for her rash. Patient denies SOB. She thinks her symptoms may be due to a shrimp pot pie from [employer] that she ate last night around 9:00 PM. Her husband has also been having similar rashes, but he took a Benadryl and is fine now.  Review of Systems  Skin: Positive for rash.       Objective:   Physical Exam CONSTITUTIONAL: Well developed/well nourished HEAD: Normocephalic/atraumatic EYES: EOM/PERRL. Puffiness around both eyes ENMT: Mucous membranes moist NECK: supple no meningeal signs SPINE: entire spine nontender CV: S1/S2 noted, no murmurs/rubs/gallops noted LUNGS: Lungs are clear to auscultation bilaterally, no apparent distress. No stridor or wheezes. ABDOMEN: soft, nontender, no rebound or guarding GU: no cva tenderness NEURO: Pt is awake/alert, moves all extremitiesx4 EXTREMITIES: pulses normal, full ROM SKIN: Giant urticaria on her trunk, worse on the chest and upper back PSYCH: no abnormalities of mood noted  Glu     Assessment & Plan:  IV fluids started. She was given 10 of Zyrtec along with 300 of ranitidine. She was given 25 Benadryl IM. Initial blood pressure was 94/60. She is known to be hypertensive. Glucose checked was 395 so the Solu-Medrol was not given. She will be sent to the hospital for evaluation to be sure her reaction subsides and to monitor her blood pressure. She also voiced some complaints of severe depression feeling very sad  but no  definite thoughts of suicide . these also can be addressed at the emergency room.I personally performed the services described in this documentation, which was scribed in my presence. The recorded information has been reviewed and is accurate.  Earl Lites, MD

## 2014-10-19 NOTE — ED Notes (Signed)
Bed: WA11 Expected date:  Expected time:  Means of arrival:  Comments: ems 

## 2014-10-19 NOTE — ED Notes (Signed)
Per EMS, pt from home, reports possible allergic reaction to shrimp pot pie which she ate at 2100 at night.  She started to have itching, eye swelling and hives today at 1500.  Pt went to her PCP, received benadryl 25mg  IM, zantac 10mg  PO.  Pt also reports depression d/t on the verge of her house getting foreclosed.   Requesting resources.

## 2014-10-19 NOTE — Discharge Instructions (Signed)
Take the prescribed medication as directed starting tomorrow-- you have already had today's dose.  Continue benadryl as well. Follow-up with your primary care physician. Return to the ED for new or worsening symptoms-- sensation of throat swelling, shortness of breath, difficulty swallowing, etc.

## 2014-10-19 NOTE — ED Provider Notes (Signed)
CSN: [id number]     Arrival date & time 10/19/14  1824 History   First MD Initiated Contact with Patient 10/19/14 1828     Chief Complaint  Patient presents with  . Allergic Reaction     (Consider location/radiation/quality/duration/timing/severity/associated sxs/prior Treatment) The history is provided by the patient and medical records.    This is a 54 year old female with past medical history significant for hypertension, hyperlipidemia, diabetes, seasonal allergies, presenting to the ED for possible allergic reaction to shrimp [place] she ate last night. Patient states these were pre-made frozen pot-pies from [employer].  She states she and her husband both ate them last night, he developed some symptoms last night but his were relieved with benadryl.  She states this afternoon she developed hives, eye swelling, and generalized itching around 1500.  She states she was seen at PCP office, given benadryl and zantac and sent here for further evaluation.  She denies any sensataion of throat swelling or shortness of breath.  No chest pain.  Patient states she has multiple allergies including grasses various trees. She has no known food allergies. She denies any new soaps, detergents, personal care products, or medications. No fever or chills.  VSS.  Past Medical History  Diagnosis Date  . Hypertension   . Hyperlipidemia   . Allergy   . Diabetes mellitus   . Hypersomnia, persistent 02/24/2013  . Snoring disorder 02/24/2013   No past surgical history on file. Family History  Problem Relation Age of Onset  . Kidney disease Mother   . Heart disease Father   . Heart disease Sister    History  Substance Use Topics  . Smoking status: Never Smoker   . Smokeless tobacco: Not on file  . Alcohol Use: No   OB History    No data available     Review of Systems  Skin: Positive for rash.  All other systems reviewed and are negative.     Allergies  Lisinopril  Home Medications    Prior to Admission medications   Medication Sig Start Date End Date Taking? Authorizing Provider  amLODipine (NORVASC) 10 MG tablet Take 1 tablet (10 mg total) by mouth daily. 10/20/13   Elvina Sidle, MD  aspirin 81 MG tablet Take 81 mg by mouth daily.    Historical Provider, MD  carvedilol (COREG) 25 MG tablet Take 1 tablet (25 mg total) by mouth 2 (two) times daily. PATIENT NEEDS OFFICE VISIT FOR ADDITIONAL REFILLS 10/19/14   Collie Siad English, PA  cloNIDine HCl (KAPVAY) 0.1 MG TB12 ER tablet Take 1 tablet (0.1 mg total) by mouth at bedtime. 10/20/13   Elvina Sidle, MD  fexofenadine (ALLEGRA) 180 MG tablet Take 1 tablet (180 mg total) by mouth daily. 12/11/11   Elvina Sidle, MD  ipratropium (ATROVENT) 0.03 % nasal spray Place 2 sprays into the nose 4 (four) times daily. 05/03/14   Peyton Najjar, MD  LORazepam (ATIVAN) 0.5 MG tablet TAKE 1 TABLET BY MOUTH TWICE A DAY AS NEEDED 05/09/14   Elvina Sidle, MD  losartan-hydrochlorothiazide (HYZAAR) 100-25 MG per tablet Take 1 tablet by mouth daily. PATIENT NEEDS OFFICE VISIT FOR ADDITIONAL REFILLS 10/19/14   Collie Siad English, PA  metFORMIN (GLUCOPHAGE) 1000 MG tablet Take 1 tablet (1,000 mg total) by mouth 2 (two) times daily with a meal. 10/20/13   Elvina Sidle, MD  mometasone (NASONEX) 50 MCG/ACT nasal spray Place 2 sprays into the nose as needed. 05/03/14   Peyton Najjar, MD  simvastatin (  ZOCOR) 20 MG tablet Take 1 tablet (20 mg total) by mouth at bedtime. PATIENT NEEDS OFFICE VISIT FOR ADDITIONAL REFILLS 10/19/14   Collie Siad English, PA  sitaGLIPtin (JANUVIA) 100 MG tablet Take 1 tablet (100 mg total) by mouth daily. Patient not taking: Reported on 10/19/2014 05/03/14   Peyton Najjar, MD   BP 141/88 mmHg  Pulse 74  Temp(Src) 98.4 F (36.9 C) (Oral)  Resp 16  SpO2 100%   Physical Exam  Constitutional: She is oriented to person, place, and time. She appears well-developed and well-nourished.  HENT:  Head: Normocephalic and  atraumatic.  Mouth/Throat: Oropharynx is clear and moist.  No oral swelling, no oral lesions, handling secretions well, no difficulty swallowing or speaking  Eyes: Conjunctivae and EOM are normal. Pupils are equal, round, and reactive to light.  Mild peri-orbital edema bilaterally; no lid erythema   Neck: Normal range of motion.  Cardiovascular: Normal rate, regular rhythm and normal heart sounds.   Pulmonary/Chest: Effort normal and breath sounds normal.  Abdominal: Soft. Bowel sounds are normal.  Musculoskeletal: Normal range of motion.  Neurological: She is alert and oriented to person, place, and time.  Skin: Skin is warm and dry. Rash noted. Rash is urticarial.  Urticarial rash of BUE and trunk; no signs of superimposed infection; no ulcerations or open wounds; no lesions on palms/soles  Psychiatric: She has a normal mood and affect.  Nursing note and vitals reviewed.   ED Course  Procedures (including critical care time) Labs Review Labs Reviewed - No data to display  Imaging Review No results found.   EKG Interpretation None      MDM   Final diagnoses:  Allergic reaction, initial encounter   54 year old female here with alleged allergic reaction from shrimp pot pie that she ate last night. She has no known history of allergies to shrimp. Patient afebrile, nontoxic in appearance and in no acute distress. She has some urticaria of her bilateral upper extremities and torso. There are no oral lesions. Her airway is widely patent, she is handling secretions well and has no difficulty swallowing or speaking. Patient was given IV Solu-Medrol, Benadryl, Pepcid, and IV fluids with improvement of her symptoms. She remains without any signs of distress or imminent airway compromise, VSS. Patient will be discharged home with steroid taper. She was instructed to continue Benadryl as needed for itching. She will follow-up with her PCP.  Discussed plan with patient, he/she acknowledged  understanding and agreed with plan of care.  Return precautions given for new or worsening symptoms.  Garlon Hatchet, PA-C 10/19/14 2105  Raeford Razor, MD 10/20/14 [phone]

## 2014-10-21 ENCOUNTER — Other Ambulatory Visit: Payer: Self-pay | Admitting: Family Medicine

## 2014-11-23 ENCOUNTER — Other Ambulatory Visit: Payer: Self-pay

## 2014-11-23 MED ORDER — LOSARTAN POTASSIUM-HCTZ 100-25 MG PO TABS: 1.0000 | ORAL_TABLET | Freq: Every day | ORAL | Status: DC

## 2014-11-23 MED ORDER — SIMVASTATIN 20 MG PO TABS: 20.0000 mg | ORAL_TABLET | Freq: Every day | ORAL | Status: DC

## 2014-12-05 ENCOUNTER — Other Ambulatory Visit: Payer: Self-pay | Admitting: Physician Assistant

## 2014-12-13 ENCOUNTER — Encounter: Payer: Self-pay | Admitting: *Deleted

## 2014-12-15 ENCOUNTER — Other Ambulatory Visit: Payer: Self-pay | Admitting: Physician Assistant

## 2014-12-15 ENCOUNTER — Other Ambulatory Visit: Payer: Self-pay | Admitting: Family Medicine

## 2014-12-18 ENCOUNTER — Ambulatory Visit (INDEPENDENT_AMBULATORY_CARE_PROVIDER_SITE_OTHER): Payer: Federal, State, Local not specified - PPO | Admitting: Family Medicine

## 2014-12-18 VITALS — BP 118/82 | HR 77 | Temp 98.4°F | Resp 14 | Ht 58.5 in | Wt 128.0 lb

## 2014-12-18 DIAGNOSIS — M542 Cervicalgia: Secondary | ICD-10-CM

## 2014-12-18 DIAGNOSIS — M79645 Pain in left finger(s): Secondary | ICD-10-CM | POA: Diagnosis not present

## 2014-12-18 MED ORDER — CYCLOBENZAPRINE HCL 5 MG PO TABS
5.0000 mg | ORAL_TABLET | Freq: Three times a day (TID) | ORAL | Status: DC | PRN
Start: 2014-12-18 — End: 2015-04-08

## 2014-12-18 NOTE — Progress Notes (Signed)
[facility] [address] [place] [address] [phone]  Date:  12/18/2014   Name:  Nancy Evans   DOB:  07/19/1960   MRN:  [id number]  PCP:  Tally Due, MD    Chief Complaint: Muscle Pain and Hand Pain   History of Present Illness:  Nancy Evans is a 54 y.o. very pleasant female patient who presents with the following:  Thursday of last week started with right sided neck pain Has been constant and not improving or worsening Last night felt like it was associated with trouble swallowing because the" vein in neck twitches" Has tried ibuprofen which did not help, took four over the day with no improvement Think she might have slept on it wrong but has no known injury Never hurt neck before Tried Some massage but did not significantly improve just mild improvement Located on middle of right neck radiating down into upper right back Worse with extension, flexion and right rotation Better with massage but nothing else improves No swelling in neck, no redness, no fevers 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 Depression 10/19/2014  . Hypersomnia, persistent 02/24/2013  . Snoring disorder 02/24/2013  . Hypertension 12/11/2011  . Type 2 diabetes mellitus 12/11/2011  . Allergic rhinitis 12/11/2011  . Hyperlipidemia 12/11/2011    Past Medical History  Diagnosis Date  . Hypertension   . Hyperlipidemia   . Allergy   . Diabetes mellitus   . Hypersomnia, persistent 02/24/2013  . Snoring disorder 02/24/2013    History reviewed. No pertinent  past surgical history.  History  Substance Use Topics  . Smoking status: Never Smoker   . Smokeless tobacco: Not on file  . Alcohol Use: No    Family History  Problem Relation Age of Onset  . Kidney disease Mother   . Heart disease Father   . Heart disease Sister     Allergies  Allergen Reactions  . Lisinopril Swelling    Medication list has been reviewed and updated.  Current Outpatient Prescriptions on File Prior to Visit  Medication Sig Dispense Refill  . amLODipine (NORVASC) 10 MG tablet TAKE 1 TABLET BY MOUTH EVERY DAY 30 tablet 3  . aspirin 81 MG tablet Take 81 mg by mouth every evening.     . carvedilol (COREG) 25 MG tablet Take 1 tablet (25 mg total) by mouth 2 (two) times daily. 60 tablet 3  . cloNIDine HCl (KAPVAY) 0.1 MG TB12 ER tablet Take 1 tablet (0.1 mg total) by mouth at bedtime. 90 tablet 3  . fexofenadine (ALLEGRA) 180 MG tablet Take 1 tablet (180 mg total) by mouth daily. 90 tablet 3  . ipratropium (ATROVENT) 0.03 % nasal spray Place 2 sprays into the nose 4 (four) times daily. 30 mL 1  . LORazepam (ATIVAN) 0.5 MG tablet Take 0.5 mg by mouth 2 (two) times daily as needed for anxiety.    Marland Kitchen losartan-hydrochlorothiazide (HYZAAR) 100-25 MG per tablet Take 1 tablet by mouth daily. NO MORE REFILLS WITHOUT BLOOD  PRESSURE CHECK UP - 2ND NOTICE 15 tablet 0  . metFORMIN (GLUCOPHAGE) 1000 MG tablet Take 1 tablet (1,000 mg total) by mouth 2 (two) times daily with a meal. 180 tablet 3  . mometasone (NASONEX) 50 MCG/ACT nasal spray Place 2 sprays into the nose as needed. (Patient taking differently: Place 2 sprays into the nose as needed (allergies). ) 17 g 11  . simvastatin (ZOCOR) 20 MG tablet Take 1 tablet (20 mg total) by mouth at bedtime. NO MORE REFILLS WITHOUT CHOLESTEROL CHECK UP/FASTING LABS - 2ND NOTICE 154 tablet 0   No current facility-administered medications on file prior to visit.    Review of Systems:  Gen: no fevers, night sweats, weight loss HEENT:  cough that resolved, no congestion, rhinorrhea,denies ear pain, masses on neck, no sore throat, intermittent HA but not associated with worsening neck pain, neck pain does radiate up behind right ear, no visual change or eye pain Cardio: denies CP, plapitations, tachycardia, bradycardia Pulm: denies SOB, SOB on exertion, accessory muscle use  GI: denies abd pain, N/V/diarrhea, incontinence, hematochezia,  GU: denies penile pain, denies dischrage, denies dysuria, hematuria Musculo: denies new onset weakness, denies pain in extremities, denies sensation loss, endorses nml motor function Neuro: denies focal deficits, denies new onset numbness or tingling Integ: denies rash, denies skin lesions   Physical Examination: Filed Vitals:   12/18/14 0958  BP: 118/82  Pulse: 77  Temp: 98.4 F (36.9 C)  Resp: 14   Filed Vitals:   12/18/14 0958  Height: 4' 10.5" (1.486 m)  Weight: 128 lb (58.06 kg)   Body mass index is 26.29 kg/(m^2). Ideal Body Weight: Weight in (lb) to have BMI = 25: 121.4  GEN: WDWN, NAD, Non-toxic, A & O x 3 HEENT: Atraumatic, Normocephalic. Neck supple. No masses, No LAD.  Ears and Nose: No external deformity. Right mild cerumen impaction no irritation or signs of infection of canal CV: RRR, No M/G/R. No JVD. No thrill. No extra heart sounds. PULM: CTAB, no wheezes, crackles, rhonchi. No retractions. No resp. distress. No accessory muscle use. MUSCULO: evidence of bony change in DIP of third left finger, decreased ROM in flexion of 3rd DIP joint, does not have surrounding erythema or warmth or TTP, TTP of right trapezius muscle up to insertion point on proximal neck with evidence of spasm, pain with flexion, extension and right rotation in right trapezius muscle NEURO Normal gait. CNII-XII grossly intact PSYCH: Normally interactive. Conversant. Not depressed or anxious appearing.  Calm demeanor.    Assessment and Plan: Musculoskeletal neck pain  Pain in finger of left  hand  Evidence of muscle spasm or and TTP in the right trapezius muscle Does not endorse visual change, worst HA of life, or nuchal rigitidy Will treat with flexeril as needed up to 3 times daily Warned of sedating effect Also will try conservative ROM exercises and trapezius strain PT exercises  Counseled on use of heat and ice  Exam of left fingers consistent with arthritic change Discussed consideration of imaging but patient does not endorse she wants that today Will treat with conservative OTC analgesia RTC for re evaluation if any signs of significant swelling, with erythema or fever  Signed Ben Adams-Doolittle, DO

## 2014-12-18 NOTE — Patient Instructions (Signed)
Evidence of muscle spasm or and TTP in the right trapezius muscle Will treat with flexeril as needed up to 3 times daily Warned of sedating effect Also will try conservative ROM exercises and trapezius strain PT exercises would recommend doing exercises 3-4 times per day  use of heat and ice intermittently if it helps with pain   Exam of left fingers consistent with arthritic change Will treat with conservative OTC analgesia RTC for re evaluation if any signs of significant swelling, with erythema or fever

## 2014-12-19 NOTE — Progress Notes (Signed)
Agree with A/P. Dr Darrol Brandenburg 

## 2015-02-21 ENCOUNTER — Telehealth: Payer: Self-pay | Admitting: *Deleted

## 2015-02-21 NOTE — Telephone Encounter (Signed)
Dr. Cleta Albertsaub wanted to call to ask pt is she getting her BP meds, Norvasc.  Pt states that she has not filled the medication because she has accumulated a lot over time and she has some at home and is taking it twice a day.  She will be in tomorrow to get refill on other meds.

## 2015-02-22 ENCOUNTER — Ambulatory Visit (INDEPENDENT_AMBULATORY_CARE_PROVIDER_SITE_OTHER): Payer: Federal, State, Local not specified - PPO | Admitting: Family Medicine

## 2015-02-22 ENCOUNTER — Other Ambulatory Visit: Payer: Self-pay | Admitting: Family Medicine

## 2015-02-22 VITALS — BP 120/72 | HR 81 | Temp 98.3°F | Resp 18 | Ht 59.0 in | Wt 129.0 lb

## 2015-02-22 DIAGNOSIS — I10 Essential (primary) hypertension: Secondary | ICD-10-CM

## 2015-02-22 DIAGNOSIS — E785 Hyperlipidemia, unspecified: Secondary | ICD-10-CM

## 2015-02-22 DIAGNOSIS — Z23 Encounter for immunization: Secondary | ICD-10-CM | POA: Diagnosis not present

## 2015-02-22 DIAGNOSIS — G4700 Insomnia, unspecified: Secondary | ICD-10-CM | POA: Diagnosis not present

## 2015-02-22 DIAGNOSIS — E119 Type 2 diabetes mellitus without complications: Secondary | ICD-10-CM | POA: Diagnosis not present

## 2015-02-22 DIAGNOSIS — M1990 Unspecified osteoarthritis, unspecified site: Secondary | ICD-10-CM | POA: Diagnosis not present

## 2015-02-22 DIAGNOSIS — J302 Other seasonal allergic rhinitis: Secondary | ICD-10-CM

## 2015-02-22 LAB — COMPLETE METABOLIC PANEL WITH GFR
ALT: 19 U/L (ref 6–29)
AST: 16 U/L (ref 10–35)
Albumin: 4.4 g/dL (ref 3.6–5.1)
Alkaline Phosphatase: 87 U/L (ref 33–130)
BUN: 13 mg/dL (ref 7–25)
CO2: 26 mmol/L (ref 20–31)
Calcium: 9.9 mg/dL (ref 8.6–10.4)
Chloride: 99 mmol/L (ref 98–110)
Creat: 0.75 mg/dL (ref 0.50–1.05)
GFR, Est African American: >89 mL/min (ref >=60)
GFR, Est Non African American: >89 mL/min (ref >=60)
Glucose, Bld: 310 mg/dL — ABNORMAL HIGH (ref 65–99)
Potassium: 4.1 mmol/L (ref 3.5–5.3)
Sodium: 137 mmol/L (ref 135–146)
Total Bilirubin: 0.6 mg/dL (ref 0.2–1.2)
Total Protein: 7.7 g/dL (ref 6.1–8.1)

## 2015-02-22 LAB — HEMOGLOBIN A1C: Hgb A1c MFr Bld: 11.5 % — AB (ref 4.0–6.0)

## 2015-02-22 LAB — LIPID PANEL
Cholesterol: 184 mg/dL (ref 125–200)
HDL: 45 mg/dL — ABNORMAL LOW (ref >=46)
LDL Cholesterol: 94 mg/dL (ref <130)
Total CHOL/HDL Ratio: 4.1 Ratio (ref <=5.0)
Triglycerides: 224 mg/dL — ABNORMAL HIGH (ref <150)
VLDL: 45 mg/dL — ABNORMAL HIGH (ref <30)

## 2015-02-22 LAB — MICROALBUMIN, URINE: Microalb, Ur: 54 mg/dL — ABNORMAL HIGH (ref <2.0)

## 2015-02-22 LAB — POCT GLYCOSYLATED HEMOGLOBIN (HGB A1C): Hemoglobin A1C: 11.5

## 2015-02-22 MED ORDER — AMLODIPINE BESYLATE 10 MG PO TABS: 10.0000 mg | ORAL_TABLET | Freq: Every day | ORAL | Status: AC

## 2015-02-22 MED ORDER — IPRATROPIUM BROMIDE 0.03 % NA SOLN: 2.0000 | Freq: Four times a day (QID) | NASAL | Status: DC

## 2015-02-22 MED ORDER — SIMVASTATIN 40 MG PO TABS: 40.0000 mg | ORAL_TABLET | Freq: Every day | ORAL | Status: DC

## 2015-02-22 MED ORDER — METFORMIN HCL 1000 MG PO TABS: 1000.0000 mg | ORAL_TABLET | Freq: Two times a day (BID) | ORAL | Status: DC

## 2015-02-22 MED ORDER — CARVEDILOL 25 MG PO TABS: 25.0000 mg | ORAL_TABLET | Freq: Two times a day (BID) | ORAL | Status: DC

## 2015-02-22 MED ORDER — MOMETASONE FUROATE 50 MCG/ACT NA SUSP: 2.0000 | NASAL | Status: AC | PRN

## 2015-02-22 MED ORDER — LOSARTAN POTASSIUM-HCTZ 100-25 MG PO TABS: 1.0000 | ORAL_TABLET | Freq: Every day | ORAL | Status: DC

## 2015-02-22 MED ORDER — SIMVASTATIN 20 MG PO TABS: 20.0000 mg | ORAL_TABLET | Freq: Every day | ORAL | Status: DC

## 2015-02-22 MED ORDER — LORAZEPAM 0.5 MG PO TABS: 0.5000 mg | ORAL_TABLET | Freq: Every day | ORAL | Status: DC

## 2015-02-22 MED ORDER — FEXOFENADINE HCL 180 MG PO TABS: 180.0000 mg | ORAL_TABLET | Freq: Every day | ORAL | Status: DC

## 2015-02-22 MED ORDER — CLONIDINE HCL ER 0.1 MG PO TB12: 0.1000 mg | ORAL_TABLET | Freq: Every day | ORAL | Status: AC

## 2015-02-22 NOTE — Progress Notes (Signed)
° °  Subjective:    Patient ID: Nancy Evans, female    DOB: [birth year], 54 y.o.   MRN: [id number] This chart was scribed for Elvina SidleKurt Lauenstein, MD by Littie Deedsichard Sun, Medical Scribe. This patient was seen in Room 10 and the patient's care was started at 10:16 AM.   HPI HPI Comments: Nancy Evans is a 54 y.o. female with a history of hypertension, type II DM, and hyperlipidemia who presents to the [facility] for a medication refill. Patient has been feeling well overall. She reports having some nodules in the joints in some of her fingers, some of which are painful. Patient also reports having intermittent tinnitus in her ears, mostly in her right ear. She has not had any recent lab work done. She has her eyes examined every year and is due for another eye exam.  Her job involves some typing. Patient is married.  Review of Systems  HENT: Positive for tinnitus.   Musculoskeletal: Positive for joint swelling and arthralgias.       Objective:   Physical Exam CONSTITUTIONAL: Well developed/well nourished HEAD: Normocephalic/atraumatic EYES: Fundi normal ENMT: Oropharynx clear NECK: No carotid bruit SPINE: entire spine nontender CV: S1/S2 noted, no murmurs/rubs/gallops noted LUNGS: Lungs are clear to auscultation bilaterally, no apparent distress ABDOMEN: soft, nontender, no rebound or guarding GU: no cva tenderness NEURO: Pt is awake/alert, moves all extremitiesx4 EXTREMITIES: No edema, but she does have Heberden's nodes in DIP joints of the fingers. SKIN: warm, color normal PSYCH: no abnormalities of mood noted  Results for orders placed or performed in visit on 02/22/15  POCT glycosylated hemoglobin (Hb A1C)  Result Value Ref Range   Hemoglobin A1C 11.5        Assessment & Plan:   By signing my name below, I, Littie Deedsichard Sun, attest that this documentation has been prepared under the direction and in the presence of Elvina SidleKurt Lauenstein, MD.  Electronically Signed:  Littie Deedsichard Sun, Medical Scribe. 02/22/2015. 10:11 AM. This chart was scribed in my presence and reviewed by me personally.    ICD-9-CM ICD-10-CM   1. Essential hypertension 401.9 I10 amLODipine (NORVASC) 10 MG tablet     carvedilol (COREG) 25 MG tablet     cloNIDine HCl (KAPVAY) 0.1 MG TB12 ER tablet     losartan-hydrochlorothiazide (HYZAAR) 100-25 MG tablet     Microalbumin, urine  2. Hyperlipidemia 272.4 E78.5 metFORMIN (GLUCOPHAGE) 1000 MG tablet     simvastatin (ZOCOR) 20 MG tablet     Lipid panel  3. Controlled type 2 diabetes mellitus without complication, without long-term current use of insulin (HCC) 250.00 E11.9 metFORMIN (GLUCOPHAGE) 1000 MG tablet     POCT glycosylated hemoglobin (Hb A1C)     COMPLETE METABOLIC PANEL WITH GFR     Microalbumin, urine     Flu Vaccine QUAD 36+ mos IM     Pneumococcal polysaccharide vaccine 23-valent greater than or equal to 2yo subcutaneous/IM  4. Other seasonal allergic rhinitis 477.8 J30.2 fexofenadine (ALLEGRA) 180 MG tablet     ipratropium (ATROVENT) 0.03 % nasal spray     mometasone (NASONEX) 50 MCG/ACT nasal spray  5. Insomnia 780.52 G47.00 LORazepam (ATIVAN) 0.5 MG tablet  6. Arthritis 716.90 M19.90 Ambulatory referral to Rheumatology   I discussed diet with the patient: Need to improve on the A1c level by increasing vegetables, nose, and whole fruits. Recommend rechecking in 3 months.  Signed, Elvina SidleKurt Lauenstein, MD

## 2015-02-22 NOTE — Patient Instructions (Signed)
Pneumococcal Vaccine, Polyvalent solution for injection What is this medicine? PNEUMOCOCCAL VACCINE, POLYVALENT (NEU mo KOK al vak SEEN, pol ee VEY luhnt) is a vaccine to prevent pneumococcus bacteria infection. These bacteria are a major cause of ear infections, Strep throat infections, and serious pneumonia, meningitis, or blood infections worldwide. These vaccines help the body to produce antibodies (protective substances) that help your body defend against these bacteria. This vaccine is recommended for people 2 years of age and older with health problems. It is also recommended for all adults over 50 years old. This vaccine will not treat an infection. This medicine may be used for other purposes; ask your health care provider or pharmacist if you have questions. What should I tell my health care provider before I take this medicine? They need to know if you have any of these conditions: -bleeding problems -bone marrow or organ transplant -cancer, Hodgkin's disease -fever -infection -immune system problems -low platelet count in the blood -seizures -an unusual or allergic reaction to pneumococcal vaccine, diphtheria toxoid, other vaccines, latex, other medicines, foods, dyes, or preservatives -pregnant or trying to get pregnant -breast-feeding How should I use this medicine? This vaccine is for injection into a muscle or under the skin. It is given by a health care professional. A copy of Vaccine Information Statements will be given before each vaccination. Read this sheet carefully each time. The sheet may change frequently. Talk to your pediatrician regarding the use of this medicine in children. While this drug may be prescribed for children as young as 2 years of age for selected conditions, precautions do apply. Overdosage: If you think you have taken too much of this medicine contact a poison control center or emergency room at once. NOTE: This medicine is only for you. Do not share  this medicine with others. What if I miss a dose? It is important not to miss your dose. Call your doctor or health care professional if you are unable to keep an appointment. What may interact with this medicine? -medicines for cancer chemotherapy -medicines that suppress your immune function -medicines that treat or prevent blood clots like warfarin, enoxaparin, and dalteparin -steroid medicines like prednisone or cortisone This list may not describe all possible interactions. Give your health care provider a list of all the medicines, herbs, non-prescription drugs, or dietary supplements you use. Also tell them if you smoke, drink alcohol, or use illegal drugs. Some items may interact with your medicine. What should I watch for while using this medicine? Mild fever and pain should go away in 3 days or less. Report any unusual symptoms to your doctor or health care professional. What side effects may I notice from receiving this medicine? Side effects that you should report to your doctor or health care professional as soon as possible: -allergic reactions like skin rash, itching or hives, swelling of the face, lips, or tongue -breathing problems -confused -fever over 102 degrees F -pain, tingling, numbness in the hands or feet -seizures -unusual bleeding or bruising -unusual muscle weakness Side effects that usually do not require medical attention (report to your doctor or health care professional if they continue or are bothersome): -aches and pains -diarrhea -fever of 102 degrees F or less -headache -irritable -loss of appetite -pain, tender at site where injected -trouble sleeping This list may not describe all possible side effects. Call your doctor for medical advice about side effects. You may report side effects to FDA at 1-800-FDA-1088. Where should I keep my medicine? This   does not apply. This vaccine is given in a clinic, pharmacy, doctor's office, or other health care  setting and will not be stored at home. NOTE: This sheet is a summary. It may not cover all possible information. If you have questions about this medicine, talk to your doctor, pharmacist, or health care provider.    2016, Elsevier/Gold Standard. (2007-12-02 14:32:37)   Influenza Virus Vaccine injection What is this medicine? INFLUENZA VIRUS VACCINE (in floo EN zuh VAHY ruhs vak SEEN) helps to reduce the risk of getting influenza also known as the flu. The vaccine only helps protect you against some strains of the flu. This medicine may be used for other purposes; ask your health care provider or pharmacist if you have questions. What should I tell my health care provider before I take this medicine? They need to know if you have any of these conditions: -bleeding disorder like hemophilia -fever or infection -Guillain-Barre syndrome or other neurological problems -immune system problems -infection with the human immunodeficiency virus (HIV) or AIDS -low blood platelet counts -multiple sclerosis -an unusual or allergic reaction to influenza virus vaccine, latex, other medicines, foods, dyes, or preservatives. Different brands of vaccines contain different allergens. Some may contain latex or eggs. Talk to your doctor about your allergies to make sure that you get the right vaccine. -pregnant or trying to get pregnant -breast-feeding How should I use this medicine? This vaccine is for injection into a muscle or under the skin. It is given by a health care professional. A copy of Vaccine Information Statements will be given before each vaccination. Read this sheet carefully each time. The sheet may change frequently. Talk to your healthcare provider to see which vaccines are right for you. Some vaccines should not be used in all age groups. Overdosage: If you think you have taken too much of this medicine contact a poison control center or emergency room at once. NOTE: This medicine is only  for you. Do not share this medicine with others. What if I miss a dose? This does not apply. What may interact with this medicine? -chemotherapy or radiation therapy -medicines that lower your immune system like etanercept, anakinra, infliximab, and adalimumab -medicines that treat or prevent blood clots like warfarin -phenytoin -steroid medicines like prednisone or cortisone -theophylline -vaccines This list may not describe all possible interactions. Give your health care provider a list of all the medicines, herbs, non-prescription drugs, or dietary supplements you use. Also tell them if you smoke, drink alcohol, or use illegal drugs. Some items may interact with your medicine. What should I watch for while using this medicine? Report any side effects that do not go away within 3 days to your doctor or health care professional. Call your health care provider if any unusual symptoms occur within 6 weeks of receiving this vaccine. You may still catch the flu, but the illness is not usually as bad. You cannot get the flu from the vaccine. The vaccine will not protect against colds or other illnesses that may cause fever. The vaccine is needed every year. What side effects may I notice from receiving this medicine? Side effects that you should report to your doctor or health care professional as soon as possible: -allergic reactions like skin rash, itching or hives, swelling of the face, lips, or tongue Side effects that usually do not require medical attention (report to your doctor or health care professional if they continue or are bothersome): -fever -headache -muscle aches and pains -pain, tenderness,  redness, or swelling at the injection site -tiredness This list may not describe all possible side effects. Call your doctor for medical advice about side effects. You may report side effects to FDA at 1-800-FDA-1088. Where should I keep my medicine? The vaccine will be given by a health  care professional in a clinic, pharmacy, doctor's office, or other health care setting. You will not be given vaccine doses to store at home. NOTE: This sheet is a summary. It may not cover all possible information. If you have questions about this medicine, talk to your doctor, pharmacist, or health care provider.    2016, Elsevier/Gold Standard. (2014-11-16 10:07:28)

## 2015-02-27 ENCOUNTER — Ambulatory Visit (INDEPENDENT_AMBULATORY_CARE_PROVIDER_SITE_OTHER): Payer: Federal, State, Local not specified - PPO | Admitting: Family Medicine

## 2015-02-27 VITALS — BP 124/60 | HR 72 | Temp 99.9°F | Resp 16 | Ht 59.0 in | Wt 129.0 lb

## 2015-02-27 DIAGNOSIS — IMO0001 Reserved for inherently not codable concepts without codable children: Secondary | ICD-10-CM

## 2015-02-27 DIAGNOSIS — E1165 Type 2 diabetes mellitus with hyperglycemia: Secondary | ICD-10-CM

## 2015-02-27 MED ORDER — GLIPIZIDE 5 MG PO TABS: 5.0000 mg | ORAL_TABLET | Freq: Two times a day (BID) | ORAL | Status: DC

## 2015-02-27 NOTE — Progress Notes (Signed)
 @[facility] @  This chart was scribed for Elvina SidleKurt Amadi Yoshino, MD by Andrew Auaven Small, ED Scribe. This patient was seen in room 12 and the patient's care was started at 3:22 PM.  Patient ID: Nancy Evans MRN: [id number], DOB: 1960-06-06, 54 y.o. Date of Encounter: 02/27/2015, 3:22 PM  Primary Physician: Tally DueGUEST, CHRIS WARREN, MD  Chief Complaint:  Chief Complaint  Patient presents with  . Follow-up    blood work/ Dr. Elbert EwingsL    HPI: 54 y.o. year old female with history below presents for a follow up. Pt was seen here 5 days and had blood work. She is here today to discuss results. She has started metformin, takes 1 in the morning and one at night. She does not check sugar regularly, she does not like pricking her finger.  She did not start invokana due to cost.    Past Medical History  Diagnosis Date  . Hypertension   . Hyperlipidemia   . Allergy   . Diabetes mellitus   . Hypersomnia, persistent 02/24/2013  . Snoring disorder 02/24/2013     Home Meds: Prior to Admission medications   Medication Sig Start Date End Date Taking? Authorizing Provider  amLODipine (NORVASC) 10 MG tablet Take 1 tablet (10 mg total) by mouth daily. 02/22/15  Yes Elvina SidleKurt Heidi Lemay, MD  aspirin 81 MG tablet Take 81 mg by mouth every evening.    Yes Historical Provider, MD  carvedilol (COREG) 25 MG tablet Take 1 tablet (25 mg total) by mouth 2 (two) times daily. 02/22/15  Yes Elvina SidleKurt Cacey Willow, MD  cloNIDine HCl (KAPVAY) 0.1 MG TB12 ER tablet Take 1 tablet (0.1 mg total) by mouth at bedtime. 02/22/15  Yes Elvina SidleKurt Antuane Eastridge, MD  cyclobenzaprine (FLEXERIL) 5 MG tablet Take 1 tablet (5 mg total) by mouth 3 (three) times daily as needed for muscle spasms. 12/18/14  Yes Sharlet SalinaBenjamin Adams-Doolittle, MD  fexofenadine (ALLEGRA) 180 MG tablet Take 1 tablet (180 mg total) by mouth daily. 02/22/15  Yes Elvina SidleKurt Lleyton Byers, MD  ipratropium (ATROVENT) 0.03 % nasal spray Place 2 sprays into the nose 4 (four) times daily. 02/22/15  Yes Elvina SidleKurt Fabricio Endsley,  MD  LORazepam (ATIVAN) 0.5 MG tablet Take 1 tablet (0.5 mg total) by mouth at bedtime. 02/22/15  Yes Elvina SidleKurt Beth Spackman, MD  losartan-hydrochlorothiazide (HYZAAR) 100-25 MG tablet Take 1 tablet by mouth daily. 02/22/15  Yes Elvina SidleKurt Sevannah Madia, MD  metFORMIN (GLUCOPHAGE) 1000 MG tablet Take 1 tablet (1,000 mg total) by mouth 2 (two) times daily with a meal. 02/22/15  Yes Elvina SidleKurt Detron Carras, MD  mometasone (NASONEX) 50 MCG/ACT nasal spray Place 2 sprays into the nose as needed. 02/22/15  Yes Elvina SidleKurt Arlone Lenhardt, MD  simvastatin (ZOCOR) 40 MG tablet Take 1 tablet (40 mg total) by mouth at bedtime. 02/22/15  Yes Elvina SidleKurt Halden Phegley, MD    Allergies:  Allergies  Allergen Reactions  . Lisinopril Swelling    Social History   Social History  . Marital Status: Married    Spouse Name: N/A  . Number of Children: 2  . Years of Education: 12   Occupational History  .  [employer]   Social History Main Topics  . Smoking status: Never Smoker   . Smokeless tobacco: Not on file  . Alcohol Use: No  . Drug Use: No  . Sexual Activity: Not on file   Other Topics Concern  . Not on file   Social History Narrative     Review of Systems: Constitutional: negative for chills, fever, night sweats, weight changes, or fatigue  HEENT: negative for vision changes, hearing loss, congestion, rhinorrhea, ST, epistaxis, or sinus pressure Cardiovascular: negative for chest pain or palpitations Respiratory: negative for hemoptysis, wheezing, shortness of breath, or cough Abdominal: negative for abdominal pain, nausea, vomiting, diarrhea, or constipation Dermatological: negative for rash Neurologic: negative for headache, dizziness, or syncope All other systems reviewed and are otherwise negative with the exception to those above and in the HPI.   Physical Exam: Blood pressure 124/60, pulse 72, temperature 99.9 F (37.7 C), temperature source Oral, resp. rate 16, height  (1.499 m), weight 129 lb (58.514 kg),  SpO2 98 %., Body mass index is 26.04 kg/(m^2). General: Well developed, well nourished, in no acute distress. Head: Normocephalic, atraumatic, eyes without discharge, sclera non-icteric, nares are without discharge. Bilateral auditory canals clear, TM's are without perforation, pearly grey and translucent with reflective cone of light bilaterally. Oral cavity moist, posterior pharynx without exudate, erythema, peritonsillar abscess, or post nasal drip.  Neck: Supple. No thyromegaly. Full ROM. No lymphadenopathy. Lungs: Clear bilaterally to auscultation without wheezes, rales, or rhonchi. Breathing is unlabored. Heart: RRR with S1 S2. No murmurs, rubs, or gallops appreciated. Abdomen: Soft, non-tender, non-distended with normoactive bowel sounds. No hepatomegaly. No rebound/guarding. No obvious abdominal masses. Msk:  Strength and tone normal for age. Extremities/Skin: Warm and dry. No clubbing or cyanosis. No edema. No rashes or suspicious lesions. Neuro: Alert and oriented X 3. Moves all extremities spontaneously. Gait is normal. CNII-XII grossly in tact. Psych:  Responds to questions appropriately with a normal affect.   ASSESSMENT AND PLAN:  54 y.o. year old female with  This chart was scribed in my presence and reviewed by me personally.    ICD-9-CM ICD-10-CM   1. Uncontrolled type 2 diabetes mellitus without complication, without long-term current use of insulin (HCC) 250.02 E11.65 glipiZIDE (GLUCOTROL) 5 MG tablet   By signing my name below, I, Raven Small, attest that this documentation has been prepared under the direction and in the presence of Elvina Sidle, MD.  Electronically Signed: Andrew Au, ED Scribe. 02/27/2015. 3:31 PM.  Signed, Elvina Sidle, MD 02/27/2015 3:22 PM

## 2015-03-08 ENCOUNTER — Encounter: Payer: Self-pay | Admitting: Family Medicine

## 2015-04-08 ENCOUNTER — Ambulatory Visit (INDEPENDENT_AMBULATORY_CARE_PROVIDER_SITE_OTHER): Payer: Federal, State, Local not specified - PPO | Admitting: Family Medicine

## 2015-04-08 VITALS — BP 142/70 | HR 84 | Temp 98.4°F | Resp 15 | Ht 61.0 in | Wt 127.8 lb

## 2015-04-08 DIAGNOSIS — G4700 Insomnia, unspecified: Secondary | ICD-10-CM

## 2015-04-08 DIAGNOSIS — F4321 Adjustment disorder with depressed mood: Secondary | ICD-10-CM | POA: Diagnosis not present

## 2015-04-08 MED ORDER — TRAZODONE HCL 50 MG PO TABS: ORAL_TABLET | ORAL | Status: DC

## 2015-04-08 NOTE — Progress Notes (Signed)
[facility] [address], [place] [id number] [phone]  Date:  04/08/2015   Name:  Nancy HirschfeldLilli Ann Evans   DOB:  Feb 22, 1961   MRN:  [id number]  PCP:  Tally DueGUEST, CHRIS WARREN, MD    Chief Complaint: OTHER   History of Present Illness:  Nancy Evans is a 54 y.o. very pleasant female patient who presents with the following:  Her husband passed away suddenly at their home on 11/16.  She came home and found him deceased- it was totally unexpected. She is here today because she is having a hard time sleeping and fears that she is not ready to go back to work.   They have 2 adult children- [place] in [place], and [place] he works 3 weeks at a time on the ship and then comes home to her for 3 weeks.  He is now just back on the ship.   [place], coming back home to the empty house has been hard.  She felt that she was doing ok when she was away from her home and surrounded by her family and siblings.   She works in IT at the [employer]  She is a snorer- she is not sure if she might have sleep apnea.  Never got her sleep study done.  Right now she is sleeping very little.  Normally she might fall asleep around 10pm, wake up at 2, be up for an hour or so and then get up around 8am.   However since her husband passed away her sleep patterns have been even worse.  She did not eat for a couple of days but is able to eat again now.    She and her husband had been together for about 30 years, married for 28  She does not have any grandchildren as of yet.    Denies any SI  She is able to take some sick leave from work.  I suggested that she might go stay with her daughter for a few weeks and she likes this idea.    She does have a bottle of ativan at home, and has clonidine ER that she takes at night.  She is not sure why she takes this medication.  It does not seem to make her sleepy.    Patient  Active Problem List   Diagnosis Date Noted  . Depression 10/19/2014  . Hypersomnia, persistent 02/24/2013  . Snoring disorder 02/24/2013  . Hypertension 12/11/2011  . Type 2 diabetes mellitus (HCC) 12/11/2011  . Allergic rhinitis 12/11/2011  . Hyperlipidemia 12/11/2011    Past Medical History  Diagnosis Date  . Hypertension   . Hyperlipidemia   . Allergy   . Diabetes mellitus   . Hypersomnia, persistent 02/24/2013  . Snoring disorder 02/24/2013    History reviewed. No pertinent past surgical history.  Social History  Substance Use Topics  . Smoking status: Never Smoker   . Smokeless tobacco: None  . Alcohol Use: No    Family History  Problem Relation Age of Onset  . Kidney disease Mother   . Heart disease Father   . Heart disease Sister     Allergies  Allergen Reactions  . Lisinopril Swelling    Medication list has been reviewed and updated.  Current Outpatient Prescriptions on File Prior to Visit  Medication Sig Dispense Refill  . amLODipine (NORVASC) 10 MG  tablet Take 1 tablet (10 mg total) by mouth daily. 90 tablet 3  . aspirin 81 MG tablet Take 81 mg by mouth every evening.     . carvedilol (COREG) 25 MG tablet Take 1 tablet (25 mg total) by mouth 2 (two) times daily. 180 tablet 3  . cloNIDine HCl (KAPVAY) 0.1 MG TB12 ER tablet Take 1 tablet (0.1 mg total) by mouth at bedtime. 90 tablet 3  . cyclobenzaprine (FLEXERIL) 5 MG tablet Take 1 tablet (5 mg total) by mouth 3 (three) times daily as needed for muscle spasms. 30 tablet 1  . fexofenadine (ALLEGRA) 180 MG tablet Take 1 tablet (180 mg total) by mouth daily. 90 tablet 3  . glipiZIDE (GLUCOTROL) 5 MG tablet Take 1 tablet (5 mg total) by mouth 2 (two) times daily before a meal. 60 tablet 3  . ipratropium (ATROVENT) 0.03 % nasal spray Place 2 sprays into the nose 4 (four) times daily. 30 mL 3  . LORazepam (ATIVAN) 0.5 MG tablet Take 1 tablet (0.5 mg total) by mouth at bedtime. 90 tablet 1  .  losartan-hydrochlorothiazide (HYZAAR) 100-25 MG tablet Take 1 tablet by mouth daily. 90 tablet 3  . metFORMIN (GLUCOPHAGE) 1000 MG tablet Take 1 tablet (1,000 mg total) by mouth 2 (two) times daily with a meal. 180 tablet 3  . mometasone (NASONEX) 50 MCG/ACT nasal spray Place 2 sprays into the nose as needed. 17 g 11  . simvastatin (ZOCOR) 40 MG tablet Take 1 tablet (40 mg total) by mouth at bedtime. 90 tablet 3   No current facility-administered medications on file prior to visit.    Review of Systems:  As per HPI- otherwise negative.    Physical Examination: Filed Vitals:   04/08/15 1314  BP: 142/70  Pulse: 84  Temp: 98.4 F (36.9 C)  Resp: 15   Filed Vitals:   04/08/15 1314  Height:  (1.549 m)  Weight: 127 lb 12.8 oz (57.97 kg)   Body mass index is 24.16 kg/(m^2). Ideal Body Weight: Weight in (lb) to have BMI = 25: 132   GEN: WDWN, NAD, Non-toxic, Alert & Oriented x 3.  Tearful at times but appropriate for situation.  HEENT: Atraumatic, Normocephalic.  Ears and Nose: No external deformity. EXTR: No clubbing/cyanosis/edema NEURO: Normal gait.  PSYCH: Normally interactive. Conversant. Calm demeanor.   Assessment and Plan: Grief reaction  Insomnia - Plan: traZODone (DESYREL) 50 MG tablet  Here today to discuss how to handle her grief since her husband died suddenly.  She also needs help with her insomnia and wonders what to do about her job   Reassured her that it is normal to feel grief, sadness, and to not be ready to go back to work so soon after a death.   She does not feel that she is ready to RTW and I agree- she will talk to HR and I will help her with any paperwork that is needed Decided to move her clonidine dose to daytime and start trazodone for sleep.  She may also use her ativan during the day if needed.    Signed Abbe Amsterdam, MD

## 2015-04-08 NOTE — Patient Instructions (Signed)
Change your clonidine dose to daytime.   Use the trazodone at bedtime as needed for sleep You can also take 1/2 or 1 of your ativan (lorazepam) during the day/ evening as needed for anxiety and sleep I hope that you are able to sleep better with this regimen.  We are thinking of you- let me know if we can help

## 2015-04-25 ENCOUNTER — Ambulatory Visit (INDEPENDENT_AMBULATORY_CARE_PROVIDER_SITE_OTHER): Payer: Federal, State, Local not specified - PPO | Admitting: Family Medicine

## 2015-04-25 VITALS — BP 118/80 | HR 76 | Temp 98.1°F | Resp 18 | Ht 59.0 in | Wt 130.4 lb

## 2015-04-25 DIAGNOSIS — F4321 Adjustment disorder with depressed mood: Secondary | ICD-10-CM

## 2015-04-25 DIAGNOSIS — G4700 Insomnia, unspecified: Secondary | ICD-10-CM | POA: Diagnosis not present

## 2015-04-25 MED ORDER — TRAZODONE HCL 50 MG PO TABS: ORAL_TABLET | ORAL | Status: DC

## 2015-04-25 NOTE — Progress Notes (Signed)
By signing my name below, I, Rawaa Al Rifaie, attest that this documentation has been prepared under the direction and in the presence of Elvina Sidle, MD.  Watt Climes Rifaie, Medical Scribe. 04/25/2015.  10:36 AM.   Patient ID: Nancy Evans MRN: [id number], DOB: 1961/05/06, 54 y.o. Date of Encounter: 04/25/2015  Primary Physician: Tally Due, MD  Chief Complaint:  Chief Complaint  Patient presents with  . Follow-up    for grief. Originally saw Dr. Patsy Lager was given two weeks out of the work.     HPI:  Nancy Evans is a 54 y.o. female who presents to [facility] for a follow up. Pt was seen here at the office by Dr. Patsy Lager on 11/28 for grief reaction secondary to her husband's sudden death on 04-05-2023. Pt notes that she is still trying to cope with the situation. Pt notes that she is still experiencing trouble sleeping, and loss of appetite. She indicates that she still cannot stay at her house by herself, therefore she is currently residing with her daughter in [place]. Pt is compliant with taking the trazodone. She still has not been back to work. She is interested in getting information about grief counseling.   Pt has a history of diabetes. She indicates that she has not been checking her blood sugar regularly.    Past Medical History  Diagnosis Date  . Hypertension   . Hyperlipidemia   . Allergy   . Diabetes mellitus   . Hypersomnia, persistent 02/24/2013  . Snoring disorder 02/24/2013     Home Meds: Prior to Admission medications   Medication Sig Start Date End Date Taking? Authorizing Provider  amLODipine (NORVASC) 10 MG tablet Take 1 tablet (10 mg total) by mouth daily. 02/22/15  Yes Elvina Sidle, MD  aspirin 81 MG tablet Take 81 mg by mouth every evening.    Yes Historical Provider, MD  carvedilol (COREG) 25 MG tablet Take 1 tablet (25 mg total) by mouth 2 (two) times daily. 02/22/15  Yes Elvina Sidle, MD  cloNIDine HCl  (KAPVAY) 0.1 MG TB12 ER tablet Take 1 tablet (0.1 mg total) by mouth at bedtime. 02/22/15  Yes Elvina Sidle, MD  fexofenadine (ALLEGRA) 180 MG tablet Take 1 tablet (180 mg total) by mouth daily. 02/22/15  Yes Elvina Sidle, MD  glipiZIDE (GLUCOTROL) 5 MG tablet Take 1 tablet (5 mg total) by mouth 2 (two) times daily before a meal. 02/27/15  Yes Elvina Sidle, MD  ipratropium (ATROVENT) 0.03 % nasal spray Place 2 sprays into the nose 4 (four) times daily. 02/22/15  Yes Elvina Sidle, MD  LORazepam (ATIVAN) 0.5 MG tablet Take 1 tablet (0.5 mg total) by mouth at bedtime. 02/22/15  Yes Elvina Sidle, MD  losartan-hydrochlorothiazide (HYZAAR) 100-25 MG tablet Take 1 tablet by mouth daily. 02/22/15  Yes Elvina Sidle, MD  metFORMIN (GLUCOPHAGE) 1000 MG tablet Take 1 tablet (1,000 mg total) by mouth 2 (two) times daily with a meal. 02/22/15  Yes Elvina Sidle, MD  mometasone (NASONEX) 50 MCG/ACT nasal spray Place 2 sprays into the nose as needed. 02/22/15  Yes Elvina Sidle, MD  simvastatin (ZOCOR) 40 MG tablet Take 1 tablet (40 mg total) by mouth at bedtime. 02/22/15  Yes Elvina Sidle, MD  traZODone (DESYREL) 50 MG tablet Take 1/2 or a whole at bedtime as needed for sleep 04/08/15  Yes Pearline Cables, MD    Allergies:  Allergies  Allergen Reactions  . Lisinopril Swelling  Social History   Social History  . Marital Status: Married    Spouse Name: N/A  . Number of Children: 2  . Years of Education: 12   Occupational History  .  [employer]   Social History Main Topics  . Smoking status: Never Smoker   . Smokeless tobacco: Not on file  . Alcohol Use: No  . Drug Use: No  . Sexual Activity: Not on file   Other Topics Concern  . Not on file   Social History Narrative     Review of Systems: Constitutional: negative for chills, fever, night sweats, weight changes, or fatigue. Positive for grief, loss of appetite.   HEENT: negative for vision changes, hearing  loss, congestion, rhinorrhea, ST, epistaxis, or sinus pressure Cardiovascular: negative for chest pain or palpitations Respiratory: negative for hemoptysis, wheezing, shortness of breath, or cough Abdominal: negative for abdominal pain, nausea, vomiting, diarrhea, or constipation Dermatological: negative for rash Neurologic: negative for headache, dizziness, or syncope. Psych: Positive for sleep disturbance. All other systems reviewed and are otherwise negative with the exception to those above and in the HPI.  Physical Exam: Blood pressure 118/80, pulse 76, temperature 98.1 F (36.7 C), temperature source Oral, resp. rate 18, height 4\' 11"  (1.499 m), weight 130 lb 6.4 oz (59.149 kg), SpO2 98 %., Body mass index is 26.32 kg/(m^2). General: Well developed, well nourished, in no acute distress. Head: Normocephalic, atraumatic, eyes without discharge, sclera non-icteric, nares are without discharge. Bilateral auditory canals clear, TM's are without perforation, pearly grey and translucent with reflective cone of light bilaterally. Oral cavity moist, posterior pharynx without exudate, erythema, peritonsillar abscess, or post nasal drip.  Neck: Supple. No thyromegaly. Full ROM. No lymphadenopathy. Lungs: Clear bilaterally to auscultation without wheezes, rales, or rhonchi. Breathing is unlabored. Heart: RRR with S1 S2. No murmurs, rubs, or gallops appreciated. Msk:  Strength and tone normal for age. Extremities/Skin: Warm and dry. No clubbing or cyanosis. No edema. No rashes or suspicious lesions. Neuro: Alert and oriented X 3. Moves all extremities spontaneously. Gait is normal. CNII-XII grossly in tact. Psych:  Responds to questions appropriately with a sad affect-tearful   Labs:  ASSESSMENT AND PLAN:  54 y.o. year old female with   This chart was scribed in my presence and reviewed by me personally.    ICD-9-CM ICD-10-CM   1. Insomnia 780.52 G47.00 traZODone (DESYREL) 50 MG tablet  2.  Grief reaction 309.0 F43.21 traZODone (DESYREL) 50 MG tablet   Out of work 2 more weeks, recheck in 2 weeks. We should recheck her diabetes at that time.  Signed, Elvina SidleKurt Starling Christofferson, MD  Signed, Elvina SidleKurt Dillion Stowers, MD 04/25/2015 10:27 AM

## 2015-04-25 NOTE — Patient Instructions (Signed)
Grief counseling:  Shanon RosserBarbara Farran on [address]  [phone] Tommi EmerySarah Gates  [phone] Vonna KotykNancy Guttman  [phone]  Also consult Hospice  [phone]

## 2015-05-03 ENCOUNTER — Other Ambulatory Visit: Payer: Self-pay | Admitting: Family Medicine

## 2015-05-16 ENCOUNTER — Ambulatory Visit (INDEPENDENT_AMBULATORY_CARE_PROVIDER_SITE_OTHER): Payer: Federal, State, Local not specified - PPO | Admitting: Physician Assistant

## 2015-05-16 VITALS — BP 118/82 | HR 82 | Temp 98.4°F | Resp 18 | Ht 59.25 in | Wt 128.4 lb

## 2015-05-16 DIAGNOSIS — F4321 Adjustment disorder with depressed mood: Secondary | ICD-10-CM

## 2015-05-16 NOTE — Progress Notes (Signed)
Patient ID: Nancy Evans, female    DOB: 1960-11-16, 55 y.o.   MRN: [id number]  PCP: Tally Due, MD  Subjective:   Chief Complaint  Patient presents with  . other    wants to discuss her leave of absence    HPI Presents for evaluation of grief reaction.  Nancy Evans found her husband dead on the floor of their bedroom the day before Thanksgiving. He was 55 years old. They had been married for 30 years and had two children. He had not been ill, though he had HTN and "borderline" diabetes. His father died of a massive AMI, and it is suspected that his cause of death was the same. She is waiting on the report from the medical examiner and was told it may take up to 3 months.  Since his death she has experienced terrible grief. She stayed with her daughter in [place], [place] and then with her brother in [place] until this week. She isn't able to sleep in the room they once shared, so is sleeping in the sun room. She is afraid to return home after dusk, as that triggers the memory of coming home from work at that time and finding his body. She is to RTW on Sunday 05/19/15, and requests a letter to her employer for temporary modified schedule that will allow her to return home before dusk.   She has been seen here twice to address the acute grief symptoms. She takes trazodone each night, and uses Ativan PRN. She has reached out to 3 different therapists recommended by this office, but none have returned her call yet. She has not yet called Hospice, saying that she thought they only helped kids. She is sleeping well with the medications.  She reports not wanting to get out of bed, not wanting to be around anyone or to go anywhere, but also afraid to be alone. No appetite. Not eating, "There's no one to cook for." No energy. "Crying all the time." Doesn't want to cry in front of her children because she doesn't want to add to their stress. Doesn't want to cry in front of her friends because  she is embarrassed.  Misses how funny her husband was. She hasn't done any of the things to settle his estate, "because that means he's really gone. He's not coming back."  Her son lives with her, and his girlfriend was recently visiting from [place], [place]. They have gone to [facility] for a long weekend. She has reached out to a friend to come over this afternoon to keep her company.    Review of Systems As above.    Patient Active Problem List   Diagnosis Date Noted  . Depression 10/19/2014  . Hypersomnia, persistent 02/24/2013  . Snoring disorder 02/24/2013  . Hypertension 12/11/2011  . Type 2 diabetes mellitus (HCC) 12/11/2011  . Allergic rhinitis 12/11/2011  . Hyperlipidemia 12/11/2011     Prior to Admission medications   Medication Sig Start Date End Date Taking? Authorizing Provider  amLODipine (NORVASC) 10 MG tablet Take 1 tablet (10 mg total) by mouth daily. 02/22/15  Yes Elvina Sidle, MD  aspirin 81 MG tablet Take 81 mg by mouth every evening.    Yes Historical Provider, MD  carvedilol (COREG) 25 MG tablet Take 1 tablet (25 mg total) by mouth 2 (two) times daily. 02/22/15  Yes Elvina Sidle, MD  cloNIDine HCl (KAPVAY) 0.1 MG TB12 ER tablet Take 1 tablet (0.1 mg total) by mouth at bedtime.  02/22/15  Yes Elvina SidleKurt Lauenstein, MD  fexofenadine (ALLEGRA) 180 MG tablet Take 1 tablet (180 mg total) by mouth daily. 02/22/15  Yes Elvina SidleKurt Lauenstein, MD  glipiZIDE (GLUCOTROL) 5 MG tablet Take 1 tablet (5 mg total) by mouth 2 (two) times daily before a meal. 02/27/15  Yes Elvina SidleKurt Lauenstein, MD  ipratropium (ATROVENT) 0.03 % nasal spray Place 2 sprays into the nose 4 (four) times daily. 02/22/15  Yes Elvina SidleKurt Lauenstein, MD  LORazepam (ATIVAN) 0.5 MG tablet Take 1 tablet (0.5 mg total) by mouth at bedtime. 02/22/15  Yes Elvina SidleKurt Lauenstein, MD  losartan-hydrochlorothiazide (HYZAAR) 100-25 MG tablet Take 1 tablet by mouth daily. 02/22/15  Yes Elvina SidleKurt Lauenstein, MD  metFORMIN (GLUCOPHAGE) 1000 MG  tablet Take 1 tablet (1,000 mg total) by mouth 2 (two) times daily with a meal. 02/22/15  Yes Elvina SidleKurt Lauenstein, MD  mometasone (NASONEX) 50 MCG/ACT nasal spray Place 2 sprays into the nose as needed. 02/22/15  Yes Elvina SidleKurt Lauenstein, MD  simvastatin (ZOCOR) 40 MG tablet Take 1 tablet (40 mg total) by mouth at bedtime. 02/22/15  Yes Elvina SidleKurt Lauenstein, MD  traZODone (DESYREL) 50 MG tablet One every night 04/25/15  Yes Elvina SidleKurt Lauenstein, MD     Allergies  Allergen Reactions  . Lisinopril Swelling       Objective:  Physical Exam  Constitutional: She is oriented to person, place, and time. She appears well-developed and well-nourished. She is active and cooperative. No distress.  BP 118/82 mmHg  Pulse 82  Temp(Src) 98.4 F (36.9 C) (Oral)  Resp 18  Ht 4' 11.25" (1.505 m)  Wt 128 lb 6.4 oz (58.242 kg)  BMI 25.71 kg/m2  SpO2 97%   Eyes: Conjunctivae are normal.  Pulmonary/Chest: Effort normal.  Neurological: She is alert and oriented to person, place, and time.  Skin: Skin is warm and dry.  Psychiatric: Her speech is normal and behavior is normal. Her mood appears not anxious. Her affect is not angry, not blunt, not labile and not inappropriate. Thought content is not delusional. Cognition and memory are not impaired. She does not express impulsivity or inappropriate judgment. She exhibits a depressed mood (crying). She expresses no homicidal and no suicidal ideation.           Assessment & Plan:   1. Grief reaction Continue current treatment. Encouraged her to reach out to the counselors again, including Hospice, and provided several more names. Return in 4 weeks, sooner if needed. Spent 30 minutes face-to-face counseling.   Fernande Brashelle S. Roben Tatsch, PA-C Physician Assistant-Certified [facility]

## 2015-06-03 ENCOUNTER — Telehealth: Payer: Self-pay

## 2015-06-03 NOTE — Telephone Encounter (Signed)
Patient brought in FMLA forms to be completed by Dr Patsy Lager, I have filled out what I can from the OV notes, highlighted what needs to be completed, I will place these in your box on 06/03/15, please completed and return to the FMLA/Disability box at the 102 checkout desk with in 5-7 business days.

## 2015-06-04 NOTE — Telephone Encounter (Signed)
Called and LMOM.  I hope that she is well.  i need some more info in order to complete her FMLA,.  Will try her back

## 2015-06-05 NOTE — Telephone Encounter (Signed)
Called again, no answer

## 2015-06-07 DIAGNOSIS — Z0271 Encounter for disability determination: Secondary | ICD-10-CM

## 2015-06-07 NOTE — Telephone Encounter (Signed)
Called her and discussed her FMLA and got it completed.  She is doing ok overall

## 2015-06-12 ENCOUNTER — Ambulatory Visit (INDEPENDENT_AMBULATORY_CARE_PROVIDER_SITE_OTHER): Payer: Federal, State, Local not specified - PPO | Admitting: Family Medicine

## 2015-06-12 ENCOUNTER — Encounter: Payer: Self-pay | Admitting: Family Medicine

## 2015-06-12 VITALS — BP 120/80 | HR 87 | Temp 98.2°F | Resp 16 | Ht 58.5 in | Wt 128.6 lb

## 2015-06-12 DIAGNOSIS — F4321 Adjustment disorder with depressed mood: Secondary | ICD-10-CM | POA: Diagnosis not present

## 2015-06-12 DIAGNOSIS — Z23 Encounter for immunization: Secondary | ICD-10-CM

## 2015-06-12 DIAGNOSIS — Z119 Encounter for screening for infectious and parasitic diseases, unspecified: Secondary | ICD-10-CM | POA: Diagnosis not present

## 2015-06-12 DIAGNOSIS — G4700 Insomnia, unspecified: Secondary | ICD-10-CM

## 2015-06-12 DIAGNOSIS — IMO0001 Reserved for inherently not codable concepts without codable children: Secondary | ICD-10-CM

## 2015-06-12 DIAGNOSIS — E1165 Type 2 diabetes mellitus with hyperglycemia: Secondary | ICD-10-CM | POA: Diagnosis not present

## 2015-06-12 LAB — BASIC METABOLIC PANEL
BUN: 23 mg/dL (ref 7–25)
CHLORIDE: 102 mmol/L (ref 98–110)
CO2: 20 mmol/L (ref 20–31)
Calcium: 10.1 mg/dL (ref 8.6–10.4)
Creat: 0.88 mg/dL (ref 0.50–1.05)
GLUCOSE: 185 mg/dL — AB (ref 65–99)
POTASSIUM: 3.9 mmol/L (ref 3.5–5.3)
SODIUM: 138 mmol/L (ref 135–146)

## 2015-06-12 LAB — HEMOGLOBIN A1C
HEMOGLOBIN A1C: 8.2 % — AB (ref <5.7)
Mean Plasma Glucose: 189 mg/dL — ABNORMAL HIGH (ref <117)

## 2015-06-12 NOTE — Progress Notes (Signed)
[facility] [address] [place] [address] [phone]  Date:  06/12/2015   Name:  Nancy Evans   DOB:  Feb 19, 1961   MRN:  [id number]  PCP:  Tally Due, MD    Chief Complaint: grief counseling   History of Present Illness:  Nancy Evans is a 55 y.o. very pleasant female patient who presents with the following:  Last seen by myself towards the end of November- history of DM and recent loss of her husband  Her husband passed away suddenly at their home on 11/16. She came home and found him deceased- it was totally unexpected. She is here today because she is having a hard time sleeping and fears that she is not ready to go back to work.   She is here today for a recheck.  She is having some better days, but finds that she cannot leave the house once she is in for the evening.  She is not able to leave the house after sunset, even to walk her dog as she used to do  She is back at work, but we had tried to adjust her schedule to allow her to work an earlier schedule so she can get home earlier.  She seems to have more anxiety in the evening  She sometimes finds herself waking up too early- around 2 or 3 am. This may happened 2-3x a week.  She will be up for an hour or so, and then will get back to sleep.  For sleep she is taking 50 of trazodone and 0.5 of ativan. She wonders if she could try some melatonin also  Her appetite is picking back up  Wt Readings from Last 3 Encounters:  06/12/15 128 lb 9.6 oz (58.333 kg)  05/16/15 128 lb 6.4 oz (58.242 kg)  04/25/15 130 lb 6.4 oz (59.149 kg)   Her energy level is still low but getting better overall.  Her children are doing ok- her son will be home for a 3 week visit coming up soon.  She is a bit concerned that he is also suffering from grief and she hopes that they can help each other   Lab Results  Component Value Date   HGBA1C 11.5 02/22/2015   She is seeing a hospice counselor in 2 days! She  is also seeing someone at work but she would rather have some more privacy and separation between her work and mental health care.     Patient Active Problem List   Diagnosis Date Noted  . Depression 10/19/2014  . Hypersomnia, persistent 02/24/2013  . Snoring disorder 02/24/2013  . Hypertension 12/11/2011  . Type 2 diabetes mellitus (HCC) 12/11/2011  . Allergic rhinitis 12/11/2011  . Hyperlipidemia 12/11/2011    Past Medical History  Diagnosis Date  . Hypertension   . Hyperlipidemia   . Allergy   . Diabetes mellitus   . Hypersomnia, persistent 02/24/2013  . Snoring disorder 02/24/2013    No past surgical history on file.  Social History  Substance Use Topics  . Smoking status: Never Smoker   . Smokeless tobacco: Never Used  . Alcohol Use: No    Family History  Problem Relation Age of Onset  . Kidney disease Mother   . Heart disease Father   . Heart disease Sister     Allergies  Allergen Reactions  . Lisinopril Swelling    Medication list has been reviewed and updated.  Current Outpatient Prescriptions on File  Prior to Visit  Medication Sig Dispense Refill  . amLODipine (NORVASC) 10 MG tablet Take 1 tablet (10 mg total) by mouth daily. 90 tablet 3  . aspirin 81 MG tablet Take 81 mg by mouth every evening.     . carvedilol (COREG) 25 MG tablet Take 1 tablet (25 mg total) by mouth 2 (two) times daily. 180 tablet 3  . cloNIDine HCl (KAPVAY) 0.1 MG TB12 ER tablet Take 1 tablet (0.1 mg total) by mouth at bedtime. 90 tablet 3  . fexofenadine (ALLEGRA) 180 MG tablet Take 1 tablet (180 mg total) by mouth daily. 90 tablet 3  . glipiZIDE (GLUCOTROL) 5 MG tablet Take 1 tablet (5 mg total) by mouth 2 (two) times daily before a meal. 60 tablet 3  . LORazepam (ATIVAN) 0.5 MG tablet Take 1 tablet (0.5 mg total) by mouth at bedtime. 90 tablet 1  . losartan-hydrochlorothiazide (HYZAAR) 100-25 MG tablet Take 1 tablet by mouth daily. 90 tablet 3  . metFORMIN (GLUCOPHAGE) 1000 MG  tablet Take 1 tablet (1,000 mg total) by mouth 2 (two) times daily with a meal. 180 tablet 3  . mometasone (NASONEX) 50 MCG/ACT nasal spray Place 2 sprays into the nose as needed. 17 g 11  . ipratropium (ATROVENT) 0.03 % nasal spray Place 2 sprays into the nose 4 (four) times daily. (Patient not taking: Reported on 06/12/2015) 30 mL 3  . simvastatin (ZOCOR) 40 MG tablet Take 1 tablet (40 mg total) by mouth at bedtime. (Patient not taking: Reported on 06/12/2015) 90 tablet 3  . traZODone (DESYREL) 50 MG tablet One every night (Patient not taking: Reported on 06/12/2015) 30 tablet 1   No current facility-administered medications on file prior to visit.    Review of Systems:  As per HPI- otherwise negative.   Physical Examination: Filed Vitals:   06/12/15 0805  BP: 120/80  Pulse: 87  Temp: 98.2 F (36.8 C)  Resp: 16   Filed Vitals:   06/12/15 0805  Height: 4' 10.5" (1.486 m)  Weight: 128 lb 9.6 oz (58.333 kg)   Body mass index is 26.42 kg/(m^2). Ideal Body Weight: Weight in (lb) to have BMI = 25: 121.4  GEN: WDWN, NAD, Non-toxic, A & O x 3, looks well, minimal overweight HEENT: Atraumatic, Normocephalic. Neck supple. No masses, No LAD.  Bilateral TM wnl, oropharynx normal.  PEERL,EOMI.   Ears and Nose: No external deformity. CV: RRR, No M/G/R. No JVD. No thrill. No extra heart sounds. PULM: CTA B, no wheezes, crackles, rhonchi. No retractions. No resp. distress. No accessory muscle use. EXTR: No c/c/e NEURO Normal gait.  PSYCH: Normally interactive. Conversant. Not depressed or anxious appearing.  Calm demeanor. Not tearful today   Assessment and Plan: Grief reaction  Uncontrolled type 2 diabetes mellitus without complication, without long-term current use of insulin (HCC) - Plan: Basic metabolic panel, Hemoglobin A1c  Immunization due - Plan: Tdap vaccine greater than or equal to 7yo IM  Insomnia  Screening examination for infectious disease - Plan: Hepatitis C  antibody  Recheck her A1c today and will be in touch with her tdap is due- updated Overall she is making progress through her grief.  Supported and reassured her.  Encouraged her to gradually try leaving the house in the evening, perhaps with her son- suspect this will be easier with longer days that are coming soon Ok to try a low dose of melatonin- in this case try cutting the ativan back to half.  We would like  to get her off of this medicatoin in time.    Signed Abbe Amsterdam, MD

## 2015-06-12 NOTE — Patient Instructions (Addendum)
It was great to see you today as always. Take care of yourself and have patience- I think that you are doing great! I will be in touch with your labs You got a tetanus booster today I think adding a melatonin supplement at bedtime is ok- stick to a low dose and take a 1/2 dose of your lorazepam if you do this.  We would love to get you off the lorazepam in the long term so we can start working towards this goal  I am going to a new office in a few weeks  It would be my pleasure to continue to see you as a patient at my new office, starting the last week of February.  If you prefer to remain at [facility] one of my partners will be happy to see you here- maybe Chelle?   [facility] [address] [place], [place] [address] Phone: [phone]

## 2015-06-13 ENCOUNTER — Encounter: Payer: Self-pay | Admitting: Family Medicine

## 2015-06-13 LAB — HEPATITIS C ANTIBODY: HCV Ab: NEGATIVE

## 2015-09-17 ENCOUNTER — Other Ambulatory Visit: Payer: Self-pay

## 2015-09-17 DIAGNOSIS — G4700 Insomnia, unspecified: Secondary | ICD-10-CM

## 2015-09-17 NOTE — Telephone Encounter (Signed)
Pharm reqs RF of lorazepam. Pended. 

## 2015-09-18 MED ORDER — LORAZEPAM 0.5 MG PO TABS: 0.5000 mg | ORAL_TABLET | Freq: Every day | ORAL | Status: DC

## 2015-09-18 NOTE — Telephone Encounter (Signed)
Faxed

## 2015-09-27 ENCOUNTER — Other Ambulatory Visit: Payer: Self-pay | Admitting: Family Medicine

## 2015-10-15 ENCOUNTER — Ambulatory Visit (INDEPENDENT_AMBULATORY_CARE_PROVIDER_SITE_OTHER): Payer: Federal, State, Local not specified - PPO | Admitting: Physician Assistant

## 2015-10-15 VITALS — BP 108/66 | HR 85 | Temp 98.1°F | Resp 16 | Ht 59.0 in | Wt 128.0 lb

## 2015-10-15 DIAGNOSIS — M7662 Achilles tendinitis, left leg: Secondary | ICD-10-CM

## 2015-10-15 MED ORDER — MELOXICAM 15 MG PO TABS: 15.0000 mg | ORAL_TABLET | Freq: Every day | ORAL | Status: DC

## 2015-10-15 NOTE — Progress Notes (Signed)
Nancy HirschfeldLilli Ann Evans  MRN: [id number] DOB: 1961-01-05  Subjective:  Pt presents to clinic with left ankle pain for about a month without a known injury.  She walks on concrete all day.  She has done some stretching which helps but the pain keeps coming back.  Some days are ok but other days it hurts more - she has found that her shoes with heels make the pain worse.  The pain is located around the achilles tendon.  There is no pain in the foot.  No paresthesias.  Patient Active Problem List   Diagnosis Date Noted  . Depression 10/19/2014  . Hypersomnia, persistent 02/24/2013  . Snoring disorder 02/24/2013  . Hypertension 12/11/2011  . Type 2 diabetes mellitus (HCC) 12/11/2011  . Allergic rhinitis 12/11/2011  . Hyperlipidemia 12/11/2011    Current Outpatient Prescriptions on File Prior to Visit  Medication Sig Dispense Refill  . amLODipine (NORVASC) 10 MG tablet Take 1 tablet (10 mg total) by mouth daily. 90 tablet 3  . aspirin 81 MG tablet Take 81 mg by mouth every evening.     . carvedilol (COREG) 25 MG tablet Take 1 tablet (25 mg total) by mouth 2 (two) times daily. 180 tablet 3  . cloNIDine HCl (KAPVAY) 0.1 MG TB12 ER tablet Take 1 tablet (0.1 mg total) by mouth at bedtime. 90 tablet 3  . glipiZIDE (GLUCOTROL) 5 MG tablet TAKE 1 TABLET BY MOUTH TWICE A DAY BEFORE A MEAL 60 tablet 0  . ipratropium (ATROVENT) 0.03 % nasal spray Place 2 sprays into the nose 4 (four) times daily. 30 mL 3  . LORazepam (ATIVAN) 0.5 MG tablet Take 1 tablet (0.5 mg total) by mouth at bedtime. 90 tablet 1  . losartan-hydrochlorothiazide (HYZAAR) 100-25 MG tablet Take 1 tablet by mouth daily. 90 tablet 3  . metFORMIN (GLUCOPHAGE) 1000 MG tablet Take 1 tablet (1,000 mg total) by mouth 2 (two) times daily with a meal. 180 tablet 3  . mometasone (NASONEX) 50 MCG/ACT nasal spray Place 2 sprays into the nose as needed. 17 g 11  . simvastatin (ZOCOR) 40 MG tablet Take 1 tablet (40 mg total) by mouth at bedtime. 90  tablet 3  . traZODone (DESYREL) 50 MG tablet One every night 30 tablet 1   No current facility-administered medications on file prior to visit.    Allergies  Allergen Reactions  . Lisinopril Swelling    Review of Systems  Musculoskeletal: Positive for gait problem (only when she has pain). Negative for joint swelling.   Objective:  BP 108/66 mmHg  Pulse 85  Temp(Src) 98.1 F (36.7 C)  Resp 16  Ht 4\' 11"  (1.499 m)  Wt 128 lb (58.06 kg)  BMI 25.84 kg/m2  SpO2 98%  Physical Exam  Constitutional: She is oriented to person, place, and time and well-developed, well-nourished, and in no distress.  HENT:  Head: Normocephalic and atraumatic.  Right Ear: Hearing and external ear normal.  Left Ear: Hearing and external ear normal.  Eyes: Conjunctivae are normal.  Neck: Normal range of motion.  Pulmonary/Chest: Effort normal.  Musculoskeletal:       Right ankle: Normal.       Left ankle: Achilles tendon exhibits pain. Achilles tendon exhibits no defect and normal Thompson's test results.       Feet:  Neurological: She is alert and oriented to person, place, and time. Gait normal.  Skin: Skin is warm and dry.  Psychiatric: Mood, memory, affect and judgment normal.  Vitals  reviewed.   Assessment and Plan :  Achilles tendinitis of left lower extremity - Plan: meloxicam (MOBIC) 15 MG tablet - ice massage and rest with good supportive shoes - gel heel cup - if she has no improved in the next 2-3 weeks she will call for PT evaluation and treatment  Benny Lennert PA-C  [facility] 10/15/2015 4:26 PM

## 2015-10-15 NOTE — Patient Instructions (Addendum)
   IF you received an x-ray today, you will receive an invoice from [facility]. Please contact [facility] at [phone] with questions or concerns regarding your invoice.   IF you received labwork today, you will receive an invoice from [facility]. Please contact [facility] at [phone] with questions or concerns regarding your invoice.   Our billing staff will not be able to assist you with questions regarding bills from these companies.  You will be contacted with the lab results as soon as they are available. The fastest way to get your results is to activate your My Chart account. Instructions are located on the last page of this paperwork. If you have not heard from us regarding the results in 2 weeks, please contact this office.     Achilles Tendinitis With Rehab Achilles tendinitis is a disorder of the Achilles tendon. The Achilles tendon connects the large calf muscles (Gastrocnemius and Soleus) to the heel bone (calcaneus). This tendon is sometimes called the heel cord. It is important for pushing-off and standing on your toes and is important for walking, running, or jumping. Tendinitis is often caused by overuse and repetitive microtrauma. SYMPTOMS  Pain, tenderness, swelling, warmth, and redness may occur over the Achilles tendon even at rest.  Pain with pushing off, or flexing or extending the ankle.  Pain that is worsened after or during activity. CAUSES   Overuse sometimes seen with rapid increase in exercise programs or in sports requiring running and jumping.  Poor physical conditioning (strength and flexibility or endurance).  Running sports, especially training running down hills.  Inadequate warm-up before practice or play or failure to stretch before participation.  Injury to the tendon. PREVENTION   Warm up and stretch before practice or competition.  Allow time for adequate rest and recovery between  practices and competition.  Keep up conditioning.  Keep up ankle and leg flexibility.  Improve or keep muscle strength and endurance.  Improve cardiovascular fitness.  Use proper technique.  Use proper equipment (shoes, skates).  To help prevent recurrence, taping, protective strapping, or an adhesive bandage may be recommended for several weeks after healing is complete. PROGNOSIS   Recovery may take weeks to several months to heal.  Longer recovery is expected if symptoms have been prolonged.  Recovery is usually quicker if the inflammation is due to a direct blow as compared with overuse or sudden strain. RELATED COMPLICATIONS   Healing time will be prolonged if the condition is not correctly treated. The injury must be given plenty of time to heal.  Symptoms can reoccur if activity is resumed too soon.  Untreated, tendinitis may increase the risk of tendon rupture requiring additional time for recovery and possibly surgery. TREATMENT   The first treatment consists of rest anti-inflammatory medication, and ice to relieve the pain.  Stretching and strengthening exercises after resolution of pain will likely help reduce the risk of recurrence. Referral to a physical therapist or athletic trainer for further evaluation and treatment may be helpful.  A walking boot or cast may be recommended to rest the Achilles tendon. This can help break the cycle of inflammation and microtrauma.  Arch supports (orthotics) may be prescribed or recommended by your caregiver as an adjunct to therapy and rest.  Surgery to remove the inflamed tendon lining or degenerated tendon tissue is rarely necessary and has shown less than predictable results. MEDICATION   Nonsteroidal anti-inflammatory medications, such as aspirin and ibuprofen, may be used for pain   and inflammation relief. Do not take within 7 days before surgery. Take these as directed by your caregiver. Contact your caregiver  immediately if any bleeding, stomach upset, or signs of allergic reaction occur. Other minor pain relievers, such as acetaminophen, may also be used.  Pain relievers may be prescribed as necessary by your caregiver. Do not take prescription pain medication for longer than 4 to 7 days. Use only as directed and only as much as you need.  Cortisone injections are rarely indicated. Cortisone injections may weaken tendons and predispose to rupture. It is better to give the condition more time to heal than to use them. HEAT AND COLD  Cold is used to relieve pain and reduce inflammation for acute and chronic Achilles tendinitis. Cold should be applied for 10 to 15 minutes every 2 to 3 hours for inflammation and pain and immediately after any activity that aggravates your symptoms. Use ice packs or an ice massage.  Heat may be used before performing stretching and strengthening activities prescribed by your caregiver. Use a heat pack or a warm soak. SEEK MEDICAL CARE IF:  Symptoms get worse or do not improve in 2 weeks despite treatment.  New, unexplained symptoms develop. Drugs used in treatment may produce side effects. EXERCISES RANGE OF MOTION (ROM) AND STRETCHING EXERCISES - Achilles Tendinitis  These exercises may help you when beginning to rehabilitate your injury. Your symptoms may resolve with or without further involvement from your physician, physical therapist or athletic trainer. While completing these exercises, remember:   Restoring tissue flexibility helps normal motion to return to the joints. This allows healthier, less painful movement and activity.  An effective stretch should be held for at least 30 seconds.  A stretch should never be painful. You should only feel a gentle lengthening or release in the stretched tissue. STRETCH - Gastroc, Standing   Place hands on wall.  Extend right / left leg, keeping the front knee somewhat bent.  Slightly point your toes inward on your  back foot.  Keeping your right / left heel on the floor and your knee straight, shift your weight toward the wall, not allowing your back to arch.  You should feel a gentle stretch in the right / left calf. Hold this position for __________ seconds. Repeat __________ times. Complete this stretch __________ times per day. STRETCH - Soleus, Standing   Place hands on wall.  Extend right / left leg, keeping the other knee somewhat bent.  Slightly point your toes inward on your back foot.  Keep your right / left heel on the floor, bend your back knee, and slightly shift your weight over the back leg so that you feel a gentle stretch deep in your back calf.  Hold this position for __________ seconds. Repeat __________ times. Complete this stretch __________ times per day. STRETCH - Gastrocsoleus, Standing  Note: This exercise can place a lot of stress on your foot and ankle. Please complete this exercise only if specifically instructed by your caregiver.   Place the ball of your right / left foot on a step, keeping your other foot firmly on the same step.  Hold on to the wall or a rail for balance.  Slowly lift your other foot, allowing your body weight to press your heel down over the edge of the step.  You should feel a stretch in your right / left calf.  Hold this position for __________ seconds.  Repeat this exercise with a slight bend in your   knee. Repeat __________ times. Complete this stretch __________ times per day.  STRENGTHENING EXERCISES - Achilles Tendinitis These exercises may help you when beginning to rehabilitate your injury. They may resolve your symptoms with or without further involvement from your physician, physical therapist or athletic trainer. While completing these exercises, remember:   Muscles can gain both the endurance and the strength needed for everyday activities through controlled exercises.  Complete these exercises as instructed by your physician,  physical therapist or athletic trainer. Progress the resistance and repetitions only as guided.  You may experience muscle soreness or fatigue, but the pain or discomfort you are trying to eliminate should never worsen during these exercises. If this pain does worsen, stop and make certain you are following the directions exactly. If the pain is still present after adjustments, discontinue the exercise until you can discuss the trouble with your clinician. STRENGTH - Plantar-flexors   Sit with your right / left leg extended. Holding onto both ends of a rubber exercise band/tubing, loop it around the ball of your foot. Keep a slight tension in the band.  Slowly push your toes away from you, pointing them downward.  Hold this position for __________ seconds. Return slowly, controlling the tension in the band/tubing. Repeat __________ times. Complete this exercise __________ times per day.  STRENGTH - Plantar-flexors   Stand with your feet shoulder width apart. Steady yourself with a wall or table using as little support as needed.  Keeping your weight evenly spread over the width of your feet, rise up on your toes.*  Hold this position for __________ seconds. Repeat __________ times. Complete this exercise __________ times per day.  *If this is too easy, shift your weight toward your right / left leg until you feel challenged. Ultimately, you may be asked to do this exercise with your right / left foot only. STRENGTH - Plantar-flexors, Eccentric  Note: This exercise can place a lot of stress on your foot and ankle. Please complete this exercise only if specifically instructed by your caregiver.   Place the balls of your feet on a step. With your hands, use only enough support from a wall or rail to keep your balance.  Keep your knees straight and rise up on your toes.  Slowly shift your weight entirely to your right / left toes and pick up your opposite foot. Gently and with controlled  movement, lower your weight through your right / left foot so that your heel drops below the level of the step. You will feel a slight stretch in the back of your calf at the end position.  Use the healthy leg to help rise up onto the balls of both feet, then lower weight only on the right / left leg again. Build up to 15 repetitions. Then progress to 3 consecutive sets of 15 repetitions.*  After completing the above exercise, complete the same exercise with a slight knee bend (about 30 degrees). Again, build up to 15 repetitions. Then progress to 3 consecutive sets of 15 repetitions.* Perform this exercise __________ times per day.  *When you easily complete 3 sets of 15, your physician, physical therapist or athletic trainer may advise you to add resistance by wearing a backpack filled with additional weight. STRENGTH - Plantar Flexors, Seated   Sit on a chair that allows your feet to rest flat on the ground. If necessary, sit at the edge of the chair.  Keeping your toes firmly on the ground, lift your right / left   heel as far as you can without increasing any discomfort in your ankle. Repeat __________ times. Complete this exercise __________ times a day. *If instructed by your physician, physical therapist or athletic trainer, you may add ____________________ of resistance by placing a weighted object on your right / left knee.   This information is not intended to replace advice given to you by your health care provider. Make sure you discuss any questions you have with your health care provider.   Document Released: 11/26/2004 Document Revised: 05/18/2014 Document Reviewed: 08/09/2008 Elsevier Interactive Patient Education 2016 [employer]  

## 2015-10-22 ENCOUNTER — Encounter: Payer: Self-pay | Admitting: Physician Assistant

## 2015-10-22 ENCOUNTER — Ambulatory Visit (INDEPENDENT_AMBULATORY_CARE_PROVIDER_SITE_OTHER): Payer: Federal, State, Local not specified - PPO | Admitting: Physician Assistant

## 2015-10-22 VITALS — BP 120/76 | HR 86 | Temp 98.5°F | Resp 16 | Wt 127.6 lb

## 2015-10-22 DIAGNOSIS — G4710 Hypersomnia, unspecified: Secondary | ICD-10-CM

## 2015-10-22 DIAGNOSIS — F329 Major depressive disorder, single episode, unspecified: Secondary | ICD-10-CM

## 2015-10-22 DIAGNOSIS — I10 Essential (primary) hypertension: Secondary | ICD-10-CM | POA: Diagnosis not present

## 2015-10-22 DIAGNOSIS — Z114 Encounter for screening for human immunodeficiency virus [HIV]: Secondary | ICD-10-CM

## 2015-10-22 DIAGNOSIS — E119 Type 2 diabetes mellitus without complications: Secondary | ICD-10-CM | POA: Diagnosis not present

## 2015-10-22 DIAGNOSIS — Z1159 Encounter for screening for other viral diseases: Secondary | ICD-10-CM | POA: Diagnosis not present

## 2015-10-22 DIAGNOSIS — F32A Depression, unspecified: Secondary | ICD-10-CM

## 2015-10-22 DIAGNOSIS — E785 Hyperlipidemia, unspecified: Secondary | ICD-10-CM | POA: Diagnosis not present

## 2015-10-22 LAB — COMPREHENSIVE METABOLIC PANEL
ALK PHOS: 62 U/L (ref 33–130)
ALT: 18 U/L (ref 6–29)
AST: 16 U/L (ref 10–35)
Albumin: 4.4 g/dL (ref 3.6–5.1)
BILIRUBIN TOTAL: 0.8 mg/dL (ref 0.2–1.2)
BUN: 19 mg/dL (ref 7–25)
CALCIUM: 9.6 mg/dL (ref 8.6–10.4)
CO2: 25 mmol/L (ref 20–31)
Chloride: 98 mmol/L (ref 98–110)
Creat: 0.88 mg/dL (ref 0.50–1.05)
Glucose, Bld: 296 mg/dL — ABNORMAL HIGH (ref 65–99)
POTASSIUM: 3.8 mmol/L (ref 3.5–5.3)
Sodium: 134 mmol/L — ABNORMAL LOW (ref 135–146)
Total Protein: 7.4 g/dL (ref 6.1–8.1)

## 2015-10-22 LAB — CBC WITH DIFFERENTIAL/PLATELET
Basophils Absolute: 58 cells/uL (ref 0–200)
Basophils Relative: 1 %
EOS ABS: 174 {cells}/uL (ref 15–500)
Eosinophils Relative: 3 %
HCT: 41.8 % (ref 35.0–45.0)
HEMOGLOBIN: 14.1 g/dL (ref 11.7–15.5)
Lymphocytes Relative: 28 %
Lymphs Abs: 1624 cells/uL (ref 850–3900)
MCH: 27.9 pg (ref 27.0–33.0)
MCHC: 33.7 g/dL (ref 32.0–36.0)
MCV: 82.6 fL (ref 80.0–100.0)
MONOS PCT: 7 %
MPV: 9.5 fL (ref 7.5–12.5)
Monocytes Absolute: 406 cells/uL (ref 200–950)
NEUTROS ABS: 3538 {cells}/uL (ref 1500–7800)
NEUTROS PCT: 61 %
Platelets: 238 10*3/uL (ref 140–400)
RBC: 5.06 MIL/uL (ref 3.80–5.10)
RDW: 13.7 % (ref 11.0–15.0)
WBC: 5.8 10*3/uL (ref 3.8–10.8)

## 2015-10-22 LAB — LIPID PANEL
CHOL/HDL RATIO: 2.4 ratio (ref <=5.0)
CHOLESTEROL: 107 mg/dL — AB (ref 125–200)
HDL: 45 mg/dL — AB (ref >=46)
LDL Cholesterol: 19 mg/dL (ref <130)
Triglycerides: 215 mg/dL — ABNORMAL HIGH (ref <150)
VLDL: 43 mg/dL — ABNORMAL HIGH (ref <30)

## 2015-10-22 LAB — HEMOGLOBIN A1C
Hgb A1c MFr Bld: 11.7 % — ABNORMAL HIGH (ref <5.7)
Mean Plasma Glucose: 289 mg/dL

## 2015-10-22 LAB — TSH: TSH: 1.83 mIU/L

## 2015-10-22 NOTE — Progress Notes (Signed)
Patient ID: Nancy Evans, female    DOB: [birth year], 55 y.o.   MRN: [id number]  PCP: Tally Due, MD  Subjective:   Chief Complaint  Patient presents with  . Depression    per patient has question - sleeps alot and memory is not great anymore  . Gastroesophageal Reflux     per patient in her stomach x 1 week    HPI Presents for evaluation of depression, grief reaction and possible reflux. She is also due for follow-up regarding HTN, diabetes and hyperlipidemia.  She continues to struggle with grief following the unexpected death of her husband of 31 years the day before Thanksgiving 2016. She just got the autopsy report an discovered that he'd relapsed into crack cocaine use. "I had no idea, but it said that he had cocaine in his system, and it had interacted with his medication causing his heart to stop." Their children, his relatives and their friends never knew about his addiction, which began in 2005, and she is struggling with their questions regarding the cause of his death. She is also struggling with anger, grief, overwhelming sadness. Unable to sleep in their bedroom. Unable to be away from home after dark because coming home to a dark house reminds her of the day she found him. Loss of concentration, difficulty remembering things. Loss of appetite. She has previously not wanted any kind of treatment for her symptoms. She did see a therapist, who was nice, but she felt uncomfortable taking to her, ashamed of her husband's substance use.  Intermittent burning the the epigastrum x 1 week. Previously had symptoms with lying down after eating.     Review of Systems     Patient Active Problem List   Diagnosis Date Noted  . Depression 10/19/2014  . Hypersomnia, persistent 02/24/2013  . Snoring disorder 02/24/2013  . Hypertension 12/11/2011  . Type 2 diabetes mellitus (HCC) 12/11/2011  . Allergic rhinitis 12/11/2011  . Hyperlipidemia 12/11/2011     Prior to  Admission medications   Medication Sig Start Date End Date Taking? Authorizing Provider  amLODipine (NORVASC) 10 MG tablet Take 1 tablet (10 mg total) by mouth daily. 02/22/15  Yes Elvina Sidle, MD  aspirin 81 MG tablet Take 81 mg by mouth every evening.    Yes Historical Provider, MD  carvedilol (COREG) 25 MG tablet Take 1 tablet (25 mg total) by mouth 2 (two) times daily. 02/22/15  Yes Elvina Sidle, MD  Cetirizine HCl (ZYRTEC ALLERGY PO) Take by mouth.   Yes Historical Provider, MD  cloNIDine HCl (KAPVAY) 0.1 MG TB12 ER tablet Take 1 tablet (0.1 mg total) by mouth at bedtime. 02/22/15  Yes Elvina Sidle, MD  glipiZIDE (GLUCOTROL) 5 MG tablet TAKE 1 TABLET BY MOUTH TWICE A DAY BEFORE A MEAL 09/27/15  Yes Jermond Burkemper, PA-C  ipratropium (ATROVENT) 0.03 % nasal spray Place 2 sprays into the nose 4 (four) times daily. 02/22/15  Yes Elvina Sidle, MD  LORazepam (ATIVAN) 0.5 MG tablet Take 1 tablet (0.5 mg total) by mouth at bedtime. 09/18/15  Yes Elvina Sidle, MD  losartan-hydrochlorothiazide (HYZAAR) 100-25 MG tablet Take 1 tablet by mouth daily. 02/22/15  Yes Elvina Sidle, MD  meloxicam (MOBIC) 15 MG tablet Take 1 tablet (15 mg total) by mouth daily. 10/15/15  Yes Morrell Riddle, PA-C  metFORMIN (GLUCOPHAGE) 1000 MG tablet Take 1 tablet (1,000 mg total) by mouth 2 (two) times daily with a meal. 02/22/15  Yes Elvina Sidle, MD  mometasone (NASONEX)  50 MCG/ACT nasal spray Place 2 sprays into the nose as needed. 02/22/15  Yes Elvina SidleKurt Lauenstein, MD  simvastatin (ZOCOR) 40 MG tablet Take 1 tablet (40 mg total) by mouth at bedtime. 02/22/15  Yes Elvina SidleKurt Lauenstein, MD  traZODone (DESYREL) 50 MG tablet One every night Patient not taking: Reported on 10/22/2015 04/25/15   Elvina SidleKurt Lauenstein, MD     Allergies  Allergen Reactions  . Lisinopril Swelling       Objective:  Physical Exam  Constitutional: She is oriented to person, place, and time. She appears well-developed and well-nourished. No  distress.  BP 120/76 mmHg  Pulse 86  Temp(Src) 98.5 F (36.9 C) (Oral)  Resp 16  Wt 127 lb 9.6 oz (57.879 kg)  SpO2 97%   Eyes: Conjunctivae are normal. No scleral icterus.  Neck: No thyromegaly present.  Cardiovascular: Normal rate, regular rhythm, normal heart sounds and intact distal pulses.   Pulmonary/Chest: Effort normal and breath sounds normal.  Lymphadenopathy:    She has no cervical adenopathy.  Neurological: She is alert and oriented to person, place, and time.  Skin: Skin is warm and dry.  Psychiatric: Her speech is normal and behavior is normal. Judgment and thought content normal. Her mood appears not anxious. Her affect is blunt. Her affect is not angry, not labile and not inappropriate. Cognition and memory are normal. She exhibits a depressed mood (tearful).           Assessment & Plan:   1. Type 2 diabetes mellitus without complication, without long-term current use of insulin (HCC) Await lab results Will adjust the regimen as needed. - Hemoglobin A1c - Comprehensive metabolic panel - Microalbumin, urine  2. Essential hypertension COntrolled. Continue current treatment. - CBC with Differential/Platelet - Comprehensive metabolic panel - TSH  3. Hyperlipidemia Await lab results. - Lipid panel  4. Depression Still not interested in medication. Will reach out to the therapist and schedule another visit. Reminded her that there is no judgement of her, and that the therapist won't just her husband.   5. Hypersomnia, persistent Due to #4.  6. Screening for HIV (human immunodeficiency virus) - HIV antibody   Fernande Brashelle S. Logann Whitebread, PA-C Physician Assistant-Certified [facility]

## 2015-10-22 NOTE — Progress Notes (Signed)
Subjective:    Patient ID: Nancy HirschfeldLilli Ann Evans, female    DOB: 1961-05-03, 55 y.o.   MRN: [id number]  Chief Complaint  Patient presents with  . Depression    per patient has question - sleeps alot and memory is not great anymore  . Gastroesophageal Reflux     per patient in her stomach x 1 week   HPI  Patient is a 55 yo female who presents today with complaints of continuing depression and grief over her husbands death.  He passed the day before Thanks giving and she found his body in their bedroom.  States "I thought I would be over it by now, its been 8 months, but I still think about it and cry everyday." She recently received the autopsy results and discovered he had relapsed, he had a history of substance abuse and she thought it was controlled.  "I had no idea, but it said that he had cocaine in his system, and it had interacted with his medication causing his heart to stop."  Family and friends want to know the results and she is stressed about telling them the truth, because his addiction had been a Event organisersecret.    Admits to depressed mood everyday, guilt over his death and being angry with him for dying, anhedonia, decreased appetite, very low energy, sleeps all the time, does not want to get out of bed, but the last week she has woken around 2-3am and could not go back to sleep.  She states her memory is terrible right now " I cant remember things I've done or how to do things, or the word she I am trying to find or the name of people I have known for years."  "There are very few days were I feel I am ok, I do not want to feel like this anymore."  Complains of acid reflux previously associated with supine position, but for the last week has become an intermittent burning in epigastric area, "I think it is when I am supposed to be eating. I dont eat a lot, only lunch."  She has not tried any medication for relief.  Denies CP, SOB, HA, dizziness, problems with BM , urination, or other  abdominal pain.  She expresses interest in counseling.    Review of Systems  Patient Active Problem List   Diagnosis Date Noted  . Depression 10/19/2014  . Hypersomnia, persistent 02/24/2013  . Snoring disorder 02/24/2013  . Hypertension 12/11/2011  . Type 2 diabetes mellitus (HCC) 12/11/2011  . Allergic rhinitis 12/11/2011  . Hyperlipidemia 12/11/2011   Current Outpatient Prescriptions on File Prior to Visit  Medication Sig Dispense Refill  . amLODipine (NORVASC) 10 MG tablet Take 1 tablet (10 mg total) by mouth daily. 90 tablet 3  . aspirin 81 MG tablet Take 81 mg by mouth every evening.     . carvedilol (COREG) 25 MG tablet Take 1 tablet (25 mg total) by mouth 2 (two) times daily. 180 tablet 3  . Cetirizine HCl (ZYRTEC ALLERGY PO) Take by mouth.    . cloNIDine HCl (KAPVAY) 0.1 MG TB12 ER tablet Take 1 tablet (0.1 mg total) by mouth at bedtime. 90 tablet 3  . glipiZIDE (GLUCOTROL) 5 MG tablet TAKE 1 TABLET BY MOUTH TWICE A DAY BEFORE A MEAL 60 tablet 0  . ipratropium (ATROVENT) 0.03 % nasal spray Place 2 sprays into the nose 4 (four) times daily. 30 mL 3  . LORazepam (ATIVAN) 0.5 MG tablet Take 1 tablet (  0.5 mg total) by mouth at bedtime. 90 tablet 1  . losartan-hydrochlorothiazide (HYZAAR) 100-25 MG tablet Take 1 tablet by mouth daily. 90 tablet 3  . meloxicam (MOBIC) 15 MG tablet Take 1 tablet (15 mg total) by mouth daily. 30 tablet 0  . metFORMIN (GLUCOPHAGE) 1000 MG tablet Take 1 tablet (1,000 mg total) by mouth 2 (two) times daily with a meal. 180 tablet 3  . mometasone (NASONEX) 50 MCG/ACT nasal spray Place 2 sprays into the nose as needed. 17 g 11  . simvastatin (ZOCOR) 40 MG tablet Take 1 tablet (40 mg total) by mouth at bedtime. 90 tablet 3  . traZODone (DESYREL) 50 MG tablet One every night (Patient not taking: Reported on 10/22/2015) 30 tablet 1   No current facility-administered medications on file prior to visit.   Allergies  Allergen Reactions  . Lisinopril  Swelling       Objective: BP 120/76 mmHg  Pulse 86  Temp(Src) 98.5 F (36.9 C) (Oral)  Resp 16  Wt 127 lb 9.6 oz (57.879 kg)  SpO2 97%   Physical Exam  Constitutional: She is oriented to person, place, and time. She appears well-developed and well-nourished.  Cardiovascular: Normal rate, regular rhythm, normal heart sounds and intact distal pulses.   Pulmonary/Chest: Effort normal and breath sounds normal.  Neurological: She is alert and oriented to person, place, and time.  Psychiatric: Her behavior is normal. Judgment and thought content normal. Her mood appears anxious. She exhibits a depressed mood. She expresses no homicidal and no suicidal ideation.  Patient is crying throughout exam, severely depressed by death of her husband and finding him.        Assessment & Plan:  1. Type 2 diabetes mellitus without complication, without long-term current use of insulin (HCC) Labs collected, results pending.  Will address any abnormalities noted and manage as necessary.  Will consider adjustment to diabetes medication if not adequately controlled. - Hemoglobin A1c - Comprehensive metabolic panel - Microalbumin, urine  2. Essential hypertension Stable, well controlled on current regimen.  Labs collected, results pending, further management will be addressed if indicated. - CBC with Differential/Platelet - Comprehensive metabolic panel - TSH  3. Hyperlipidemia Labs collected, results pending. - Lipid panel  4. Depression 5. Hypersomnia, persistent Patient appears to have severe depression without suicidal ideiations.  Discussed management options and decided with patient that she will call and set up an appointment with the Hospice counselor she saw previously.   Advised patient to call or return to office if symptoms persist or if she needs additional information about other options for counseling.  6. Screening for HIV (human immunodeficiency virus) Labs collected and results  pending. - HIV antibody  Patient to return for follow up in 6 months or sooner if a concern or complaint should arise.  Jayin Derousse P. Noam Franzen, PA-S

## 2015-10-22 NOTE — Patient Instructions (Addendum)
Call the counselor at [facility] and schedule another visit.    IF you received an x-ray today, you will receive an invoice from [facility]. Please contact [facility] at [phone] with questions or concerns regarding your invoice.   IF you received labwork today, you will receive an invoice from [facility]. Please contact [facility] at [phone] with questions or concerns regarding your invoice.   Our billing staff will not be able to assist you with questions regarding bills from these companies.  You will be contacted with the lab results as soon as they are available. The fastest way to get your results is to activate your My Chart account. Instructions are located on the last page of this paperwork. If you have not heard from us regarding the results in 2 weeks, please contact this office.    We recommend that you schedule a mammogram for breast cancer screening. Typically, you do not need a referral to do this. Please contact a local imaging center to schedule your mammogram.  [facility] - [phone]  *ask for the Radiology Department [facility] - [phone] or [phone]) [phone]  [facility] - [phone] [facility] - [phone] [facility] - [phone]  *ask for the Radiology Department [facility] - [phone]  *ask for the Radiology Department [facility] - [phone]  *ask for the Mammography Department [facility] - [phone]) [phone]

## 2015-10-23 LAB — HIV ANTIBODY (ROUTINE TESTING W REFLEX): HIV 1&2 Ab, 4th Generation: NONREACTIVE

## 2015-10-23 LAB — MICROALBUMIN, URINE: MICROALB UR: 13.5 mg/dL — AB

## 2015-11-01 ENCOUNTER — Encounter: Payer: Self-pay | Admitting: Physician Assistant

## 2015-11-25 ENCOUNTER — Other Ambulatory Visit: Payer: Self-pay

## 2015-11-25 MED ORDER — GLIPIZIDE 5 MG PO TABS: ORAL_TABLET | ORAL | Status: DC

## 2015-11-25 NOTE — Telephone Encounter (Signed)
Glipizide 5mg  tabs Sig: 2 tabs twice daily(increased per 6/23 letter)

## 2015-12-17 ENCOUNTER — Other Ambulatory Visit: Payer: Self-pay | Admitting: Physician Assistant

## 2015-12-17 DIAGNOSIS — M7662 Achilles tendinitis, left leg: Secondary | ICD-10-CM

## 2015-12-17 DIAGNOSIS — E119 Type 2 diabetes mellitus without complications: Secondary | ICD-10-CM | POA: Diagnosis not present

## 2015-12-17 DIAGNOSIS — E785 Hyperlipidemia, unspecified: Secondary | ICD-10-CM | POA: Diagnosis not present

## 2015-12-17 DIAGNOSIS — I10 Essential (primary) hypertension: Secondary | ICD-10-CM | POA: Diagnosis not present

## 2015-12-19 NOTE — Telephone Encounter (Signed)
Sarah, do you want to give RF or have pt RTC?

## 2015-12-20 DIAGNOSIS — E119 Type 2 diabetes mellitus without complications: Secondary | ICD-10-CM | POA: Diagnosis not present

## 2015-12-20 DIAGNOSIS — E785 Hyperlipidemia, unspecified: Secondary | ICD-10-CM | POA: Diagnosis not present

## 2016-02-25 ENCOUNTER — Other Ambulatory Visit: Payer: Self-pay | Admitting: Family Medicine

## 2016-02-25 DIAGNOSIS — E119 Type 2 diabetes mellitus without complications: Secondary | ICD-10-CM

## 2016-02-25 DIAGNOSIS — E785 Hyperlipidemia, unspecified: Secondary | ICD-10-CM

## 2016-03-08 DIAGNOSIS — B349 Viral infection, unspecified: Secondary | ICD-10-CM | POA: Diagnosis not present

## 2016-03-20 DIAGNOSIS — E785 Hyperlipidemia, unspecified: Secondary | ICD-10-CM | POA: Diagnosis not present

## 2016-03-20 DIAGNOSIS — Z23 Encounter for immunization: Secondary | ICD-10-CM | POA: Diagnosis not present

## 2016-03-20 DIAGNOSIS — I10 Essential (primary) hypertension: Secondary | ICD-10-CM | POA: Diagnosis not present

## 2016-03-20 DIAGNOSIS — E11649 Type 2 diabetes mellitus with hypoglycemia without coma: Secondary | ICD-10-CM | POA: Diagnosis not present

## 2016-03-28 ENCOUNTER — Other Ambulatory Visit: Payer: Self-pay | Admitting: Family Medicine

## 2016-03-28 DIAGNOSIS — I10 Essential (primary) hypertension: Secondary | ICD-10-CM

## 2016-03-31 ENCOUNTER — Other Ambulatory Visit: Payer: Self-pay | Admitting: Physician Assistant

## 2016-03-31 DIAGNOSIS — E785 Hyperlipidemia, unspecified: Secondary | ICD-10-CM

## 2016-03-31 DIAGNOSIS — E119 Type 2 diabetes mellitus without complications: Secondary | ICD-10-CM

## 2016-04-04 NOTE — Telephone Encounter (Signed)
10/2015 last ov and lab

## 2016-04-11 ENCOUNTER — Other Ambulatory Visit: Payer: Self-pay | Admitting: Family Medicine

## 2016-04-11 DIAGNOSIS — E785 Hyperlipidemia, unspecified: Secondary | ICD-10-CM

## 2016-04-12 NOTE — Telephone Encounter (Signed)
10/2015 last lab and ov

## 2016-05-05 ENCOUNTER — Other Ambulatory Visit: Payer: Self-pay | Admitting: Physician Assistant

## 2016-05-05 DIAGNOSIS — E119 Type 2 diabetes mellitus without complications: Secondary | ICD-10-CM

## 2016-05-05 DIAGNOSIS — E785 Hyperlipidemia, unspecified: Secondary | ICD-10-CM

## 2016-05-15 ENCOUNTER — Other Ambulatory Visit: Payer: Self-pay | Admitting: Family Medicine

## 2016-05-15 DIAGNOSIS — I10 Essential (primary) hypertension: Secondary | ICD-10-CM

## 2016-06-03 ENCOUNTER — Other Ambulatory Visit: Payer: Self-pay | Admitting: Physician Assistant

## 2016-06-03 DIAGNOSIS — E785 Hyperlipidemia, unspecified: Secondary | ICD-10-CM

## 2016-06-03 DIAGNOSIS — E119 Type 2 diabetes mellitus without complications: Secondary | ICD-10-CM

## 2016-06-12 DIAGNOSIS — H1045 Other chronic allergic conjunctivitis: Secondary | ICD-10-CM | POA: Diagnosis not present

## 2016-06-12 DIAGNOSIS — J3081 Allergic rhinitis due to animal (cat) (dog) hair and dander: Secondary | ICD-10-CM | POA: Diagnosis not present

## 2016-06-12 DIAGNOSIS — Z91013 Allergy to seafood: Secondary | ICD-10-CM | POA: Diagnosis not present

## 2016-06-12 DIAGNOSIS — J301 Allergic rhinitis due to pollen: Secondary | ICD-10-CM | POA: Diagnosis not present

## 2016-07-09 ENCOUNTER — Other Ambulatory Visit: Payer: Self-pay | Admitting: Physician Assistant

## 2016-07-09 DIAGNOSIS — E785 Hyperlipidemia, unspecified: Secondary | ICD-10-CM

## 2016-08-07 ENCOUNTER — Other Ambulatory Visit: Payer: Self-pay | Admitting: Physician Assistant

## 2016-08-07 DIAGNOSIS — E785 Hyperlipidemia, unspecified: Secondary | ICD-10-CM

## 2016-09-01 DIAGNOSIS — E11649 Type 2 diabetes mellitus with hypoglycemia without coma: Secondary | ICD-10-CM | POA: Diagnosis not present

## 2016-09-01 DIAGNOSIS — E663 Overweight: Secondary | ICD-10-CM | POA: Diagnosis not present

## 2016-09-01 DIAGNOSIS — E785 Hyperlipidemia, unspecified: Secondary | ICD-10-CM | POA: Diagnosis not present

## 2016-09-01 DIAGNOSIS — I10 Essential (primary) hypertension: Secondary | ICD-10-CM | POA: Diagnosis not present

## 2016-09-15 ENCOUNTER — Other Ambulatory Visit: Payer: Self-pay | Admitting: Physician Assistant

## 2016-09-15 DIAGNOSIS — E785 Hyperlipidemia, unspecified: Secondary | ICD-10-CM

## 2016-09-16 NOTE — Telephone Encounter (Signed)
Please advise patient of Rx authorization and need for follow-up visit and labs.  Meds ordered this encounter  Medications  . simvastatin (ZOCOR) 40 MG tablet    Sig: Take 1 tablet (40 mg total) by mouth every evening.    Dispense:  30 tablet    Refill:  0    Please notify patient that s/he needs an office visit +/- labsfor additional refills.

## 2016-10-08 ENCOUNTER — Encounter: Payer: Federal, State, Local not specified - PPO | Attending: Family Medicine | Admitting: Dietician

## 2016-10-08 ENCOUNTER — Encounter: Payer: Self-pay | Admitting: Dietician

## 2016-10-08 DIAGNOSIS — Z713 Dietary counseling and surveillance: Secondary | ICD-10-CM | POA: Insufficient documentation

## 2016-10-08 DIAGNOSIS — E119 Type 2 diabetes mellitus without complications: Secondary | ICD-10-CM

## 2016-10-08 DIAGNOSIS — E11649 Type 2 diabetes mellitus with hypoglycemia without coma: Secondary | ICD-10-CM | POA: Insufficient documentation

## 2016-10-08 DIAGNOSIS — Z6826 Body mass index (BMI) 26.0-26.9, adult: Secondary | ICD-10-CM | POA: Diagnosis not present

## 2016-10-08 NOTE — Patient Instructions (Addendum)
Make an appointment for an eye exam. Stay active.  Aim for some form of activity most days for 30 minutes.  Helps insulin resistance.  Moves sugar into the cell without the need of insulin. Avoid skipping meals. Continue to choose whole grains (brown rice, Steal cut or regular oats, whole grain pasta, Whole Wheat bread) Increase your non starchy vegetables. Have small amount of protein with each snack such as nuts.  Breakfast, lunch, and dinner daily.  Breakfast ideas:  Oatmeal, fruit, nuts OR egg or peanut butter, Wheat toast, fruit   Lunch:  Vegetables, tofu, 2/3 cup brown rice OR salad with edemame, whole grain crackers and fruit  Dinner:  Leftovers OR more vegetables, beans, lean protein OR Pho soup with tofu

## 2016-10-08 NOTE — Progress Notes (Signed)
Diabetes Self-Management Education  Visit Type: First/Initial  Appt. Start Time: 1300 Appt. End Time: 1430  10/08/2016  Nancy Evans, identified by name and date of birth, is a 56 y.o. female with a diagnosis of Diabetes: Type 2. Other hx includes HTN and OSA but never followed up to get the C-pap.  She has a poor appetite and usually eats only one meal per day.  Her last A1C was 8/9% 03/20/16.  She was told to take insulin but was afraid of low blood sugar as well as she does not want to use a needle.    Patient lives alone.  She often eats out because she doesn't like to cook just for herself.  Her husband died under Hospice care about 1 1/2 years ago and she is still grieving.  Her son and daughter live in [place].  She works for the [employer] at the [employer] on [address].    ASSESSMENT  Height 4\' 11"  (1.499 m), weight 131 lb (59.4 kg). Body mass index is 26.46 kg/m.  Weight is stable now.  She lost about 30 lbs after her husband's death.      Diabetes Self-Management Education - 10/08/16 1313      Visit Information   Visit Type First/Initial     Initial Visit   Diabetes Type Type 2   Are you taking your medications as prescribed? Yes   Date Diagnosed >10 years ago     Health Coping   How would you rate your overall health? Fair     Psychosocial Assessment   Patient Belief/Attitude about Diabetes Other (comment)  worries but "outlived my mom and I'm OK"   Self-care barriers None   Self-management support Doctor's office;Family   Other persons present Patient   Patient Concerns Glycemic Control;Nutrition/Meal planning   Special Needs None   Preferred Learning Style No preference indicated   Learning Readiness Ready   How often do you need to have someone help you when you read instructions, pamphlets, or other written materials from your doctor or pharmacy? 1 - Never   What is the last grade level you completed in school? 12th grade     Pre-Education  Assessment   Patient understands the diabetes disease and treatment process. Needs Review   Patient understands incorporating nutritional management into lifestyle. Needs Review   Patient undertands incorporating physical activity into lifestyle. Needs Review   Patient understands using medications safely. Needs Review   Patient understands monitoring blood glucose, interpreting and using results Needs Review   Patient understands prevention, detection, and treatment of acute complications. Needs Review   Patient understands prevention, detection, and treatment of chronic complications. Needs Review   Patient understands how to develop strategies to address psychosocial issues. Needs Review   Patient understands how to develop strategies to promote health/change behavior. Needs Review     Complications   Last HgB A1C per patient/outside source 8.3 %  09/01/16   How often do you check your blood sugar? 3-4 times / week   Fasting Blood glucose range (mg/dL) [id number]  045170   Postprandial Blood glucose range (mg/dL) >409>200  [id number]   Number of hypoglycemic episodes per month 0   Number of hyperglycemic episodes per week 14   Can you tell when your blood sugar is high? Yes  headache and blurry vision   What do you do if your blood sugar is high? nothing   Have you had a dilated eye exam in the past 12  months? No   Have you had a dental exam in the past 12 months? Yes   Are you checking your feet? No     Dietary Intake   Breakfast SKIPS usually or rare bagel or instant maple/sugar oatmeal   Snack (morning) none   Lunch [place] out to eat (rice, vegetables, chicken) OR salad with italina ([employer] 1/2)  1-2   Snack (afternoon) none   Dinner often skips OR chicken Systems analyst (evening) honey nut cheerios (dry) OR, strawberries or pineapple OR multigrain chips   Beverage(s) water, coffee with sweetened creamer, half sweet or half unsweetened tea when out to eat     Exercise   Exercise  Type Light (walking / raking leaves)   How many days per week to you exercise? 4   How many minutes per day do you exercise? 20   Total minutes per week of exercise 80     Patient Education   Previous Diabetes Education No   Disease state  Other (comment)  reviewed insulin resistance   Nutrition management  Role of diet in the treatment of diabetes and the relationship between the three main macronutrients and blood glucose level;Food label reading, portion sizes and measuring food.;Meal options for control of blood glucose level and chronic complications.;Information on hints to eating out and maintain blood glucose control.;Other (comment)  plant based eating tips, importance of regularly spaced meals to provide proper nutrition   Physical activity and exercise  Role of exercise on diabetes management, blood pressure control and cardiac health.;Helped patient identify appropriate exercises in relation to his/her diabetes, diabetes complications and other health issue.   Medications Reviewed patients medication for diabetes, action, purpose, timing of dose and side effects.   Monitoring Purpose and frequency of SMBG.;Identified appropriate SMBG and/or A1C goals.;Yearly dilated eye exam;Daily foot exams   Acute complications Taught treatment of hypoglycemia - the 15 rule.   Chronic complications Retinopathy and reason for yearly dilated eye exams   Psychosocial adjustment Worked with patient to identify barriers to care and solutions;Role of stress on diabetes   Personal strategies to promote health Lifestyle issues that need to be addressed for better diabetes care     Individualized Goals (developed by patient)   Nutrition General guidelines for healthy choices and portions discussed   Physical Activity Exercise 5-7 days per week;30 minutes per day   Medications take my medication as prescribed   Monitoring  test my blood glucose as discussed   Problem Solving regular meal schedule    Reducing Risk do foot checks daily;treat hypoglycemia with 15 grams of carbs if blood glucose less than 70mg /dL;examine blood glucose patterns   Health Coping discuss diabetes with (comment)  MD/RD     Post-Education Assessment   Patient understands the diabetes disease and treatment process. Demonstrates understanding / competency   Patient understands incorporating nutritional management into lifestyle. Demonstrates understanding / competency   Patient undertands incorporating physical activity into lifestyle. Demonstrates understanding / competency   Patient understands using medications safely. Demonstrates understanding / competency   Patient understands monitoring blood glucose, interpreting and using results Demonstrates understanding / competency   Patient understands prevention, detection, and treatment of acute complications. Demonstrates understanding / competency   Patient understands prevention, detection, and treatment of chronic complications. Demonstrates understanding / competency   Patient understands how to develop strategies to address psychosocial issues. Demonstrates understanding / competency   Patient understands how to develop strategies to promote health/change behavior. Demonstrates understanding / competency  Outcomes   Expected Outcomes Demonstrated interest in learning. Expect positive outcomes   Future DMSE 2 months   Program Status Completed      Individualized Plan for Diabetes Self-Management Training:   Learning Objective:  Patient will have a greater understanding of diabetes self-management. Patient education plan is to attend individual and/or group sessions per assessed needs and concerns.   Plan:   Patient Instructions  Make an appointment for an eye exam. Stay active.  Aim for some form of activity most days for 30 minutes.  Helps insulin resistance.  Moves sugar into the cell without the need of insulin. Avoid skipping meals. Continue to  choose whole grains (brown rice, Steal cut or regular oats, whole grain pasta, Whole Wheat bread) Increase your non starchy vegetables. Have small amount of protein with each snack such as nuts.  Breakfast, lunch, and dinner daily.  Breakfast ideas:  Oatmeal, fruit, nuts OR egg or peanut butter, Wheat toast, fruit   Lunch:  Vegetables, tofu, 2/3 cup brown rice OR salad with edemame, whole grain crackers and fruit  Dinner:  Leftovers OR more vegetables, beans, lean protein OR Pho soup with tofu  Discussed plant based eating within her cultural preferences.  Expected Outcomes:  Demonstrated interest in learning. Expect positive outcomes  Education material provided: Food label handouts, A1C conversion sheet, Meal plan card, My Plate and Snack sheet, Plant Based Primer  If problems or questions, patient to contact team via:  Phone  Future DSME appointment: 2 months

## 2016-10-15 ENCOUNTER — Other Ambulatory Visit: Payer: Self-pay | Admitting: Physician Assistant

## 2016-10-15 DIAGNOSIS — E785 Hyperlipidemia, unspecified: Secondary | ICD-10-CM

## 2016-10-15 NOTE — Telephone Encounter (Signed)
Please call patient. Rx authorized. Needs to schedule follow-up with fasting labs.  Meds ordered this encounter  Medications  . simvastatin (ZOCOR) 40 MG tablet    Sig: TAKE 1 TABLET BY MOUTH EVERY DAY IN THE EVENING. NEED OFFICE VISIT FOR MORE REFILLS    Dispense:  30 tablet    Refill:  0    Please notify patient that s/he needs an office visit +/- labsfor additional refills.

## 2016-11-09 DIAGNOSIS — Z713 Dietary counseling and surveillance: Secondary | ICD-10-CM | POA: Diagnosis not present

## 2016-11-13 ENCOUNTER — Telehealth: Payer: Self-pay | Admitting: Family Medicine

## 2016-11-13 ENCOUNTER — Other Ambulatory Visit: Payer: Self-pay | Admitting: Physician Assistant

## 2016-11-13 DIAGNOSIS — E785 Hyperlipidemia, unspecified: Secondary | ICD-10-CM

## 2016-11-13 NOTE — Telephone Encounter (Signed)
lmom to call an schedule an ov for med refills and labs this has been the 3rd notice for pt to f/u 

## 2016-11-13 NOTE — Telephone Encounter (Signed)
Please call patient. Rx authorized. Needs to schedule follow-up and labs. This is the 3rd notice.  Meds ordered this encounter  Medications  . simvastatin (ZOCOR) 40 MG tablet    Sig: Take 1 tablet (40 mg total) by mouth every evening.    Dispense:  30 tablet    Refill:  0    Please notify patient that s/he needs an office visit +/- labsfor additional refills. 3rd NOTICE.

## 2016-11-13 NOTE — Telephone Encounter (Signed)
lmom to call an schedule an ov for med refills and labs this has been the 3rd notice for pt to f/u

## 2016-11-23 DIAGNOSIS — Z713 Dietary counseling and surveillance: Secondary | ICD-10-CM | POA: Diagnosis not present

## 2016-12-01 DIAGNOSIS — E11649 Type 2 diabetes mellitus with hypoglycemia without coma: Secondary | ICD-10-CM | POA: Diagnosis not present

## 2016-12-01 DIAGNOSIS — I10 Essential (primary) hypertension: Secondary | ICD-10-CM | POA: Diagnosis not present

## 2016-12-01 DIAGNOSIS — E663 Overweight: Secondary | ICD-10-CM | POA: Diagnosis not present

## 2016-12-01 DIAGNOSIS — E785 Hyperlipidemia, unspecified: Secondary | ICD-10-CM | POA: Diagnosis not present

## 2016-12-02 DIAGNOSIS — E11649 Type 2 diabetes mellitus with hypoglycemia without coma: Secondary | ICD-10-CM | POA: Diagnosis not present

## 2016-12-02 DIAGNOSIS — I10 Essential (primary) hypertension: Secondary | ICD-10-CM | POA: Diagnosis not present

## 2016-12-02 DIAGNOSIS — E785 Hyperlipidemia, unspecified: Secondary | ICD-10-CM | POA: Diagnosis not present

## 2016-12-03 ENCOUNTER — Ambulatory Visit: Payer: Federal, State, Local not specified - PPO | Admitting: Dietician

## 2016-12-16 ENCOUNTER — Other Ambulatory Visit: Payer: Self-pay | Admitting: Physician Assistant

## 2016-12-16 DIAGNOSIS — E785 Hyperlipidemia, unspecified: Secondary | ICD-10-CM

## 2016-12-17 ENCOUNTER — Telehealth: Payer: Self-pay | Admitting: Family Medicine

## 2016-12-17 NOTE — Telephone Encounter (Signed)
LMOM FOR PT TO CALL TO MAKE AN OV WITH CHELLE FOR LABS AND REFILL ON SIMVASTATIN THIS IS PT 4TH NOTICE 

## 2016-12-17 NOTE — Telephone Encounter (Signed)
LMOM FOR PT TO CALL TO MAKE AN OV WITH CHELLE FOR LABS AND REFILL ON SIMVASTATIN THIS IS PT 4TH NOTICE

## 2016-12-28 DIAGNOSIS — E785 Hyperlipidemia, unspecified: Secondary | ICD-10-CM | POA: Diagnosis not present

## 2016-12-28 DIAGNOSIS — E663 Overweight: Secondary | ICD-10-CM | POA: Diagnosis not present

## 2016-12-28 DIAGNOSIS — E11649 Type 2 diabetes mellitus with hypoglycemia without coma: Secondary | ICD-10-CM | POA: Diagnosis not present

## 2016-12-28 DIAGNOSIS — I10 Essential (primary) hypertension: Secondary | ICD-10-CM | POA: Diagnosis not present

## 2017-02-17 DIAGNOSIS — Z1231 Encounter for screening mammogram for malignant neoplasm of breast: Secondary | ICD-10-CM | POA: Diagnosis not present

## 2017-02-22 DIAGNOSIS — R922 Inconclusive mammogram: Secondary | ICD-10-CM | POA: Diagnosis not present

## 2017-02-22 DIAGNOSIS — R921 Mammographic calcification found on diagnostic imaging of breast: Secondary | ICD-10-CM | POA: Diagnosis not present

## 2017-03-02 DIAGNOSIS — Z713 Dietary counseling and surveillance: Secondary | ICD-10-CM | POA: Diagnosis not present

## 2017-03-09 DIAGNOSIS — Z713 Dietary counseling and surveillance: Secondary | ICD-10-CM | POA: Diagnosis not present

## 2017-03-22 DIAGNOSIS — I10 Essential (primary) hypertension: Secondary | ICD-10-CM | POA: Diagnosis not present

## 2017-03-22 DIAGNOSIS — Z23 Encounter for immunization: Secondary | ICD-10-CM | POA: Diagnosis not present

## 2017-03-22 DIAGNOSIS — E663 Overweight: Secondary | ICD-10-CM | POA: Diagnosis not present

## 2017-03-22 DIAGNOSIS — E11649 Type 2 diabetes mellitus with hypoglycemia without coma: Secondary | ICD-10-CM | POA: Diagnosis not present

## 2017-03-22 DIAGNOSIS — E785 Hyperlipidemia, unspecified: Secondary | ICD-10-CM | POA: Diagnosis not present

## 2017-03-29 NOTE — Progress Notes (Signed)
[facility] - Clinic Note  03/30/2017     CHIEF COMPLAINT Patient presents for Retina Evaluation and Diabetic Eye Exam   HISTORY OF PRESENT ILLNESS: Nancy Evans is a 56 y.o. female who presents to the clinic today for:   HPI    Retina Evaluation    In both eyes.  Associated Symptoms Negative for Blind Spot, Glare, Shoulder/Hip pain, Fatigue, Jaw Claudication, Photophobia, Distortion, Floaters, Redness, Scalp Tenderness, Weight Loss, Fever, Trauma, Pain and Flashes.  Context:  distance vision, mid-range vision and near vision.  Treatments tried include eye drops.  Response to treatment was no improvement.  I, the attending physician,  performed the HPI with the patient and updated documentation appropriately.          Diabetic Eye Exam    Vision fluctuates with blood sugars, is blurred for distance and is blurred for near.  Associated Symptoms Negative for Blind Spot, Glare, Shoulder/Hip pain, Fatigue, Jaw Claudication, Photophobia, Distortion, Floaters, Redness, Scalp Tenderness, Weight Loss, Fever, Trauma, Pain and Flashes.  Diabetes characteristics include Type 2 and taking oral medications.  This started 15 years ago.  Blood sugar level fluctuates.  I, the attending physician,  performed the HPI with the patient and updated documentation appropriately.          Comments    Self referral seen on [facility] last week.Patient states she DM2. Denies Floaters, flashes and pain. She uses computer QD at work.Denies decreased vision . Bs 185  a week ago. A1C 9  Reports she does not monitor BS.  She reports vision fluctuates with BS which is  Controlled  by medication. She states she can see better without glasses up closes.   Uses Azelastine Gtts PRN Denies vits       Last edited by Rennis ChrisZamora, Bianka Liberati, MD on 03/30/2017 11:14 AM. (History)    Pt states that she saw Dr. Vanessa BarbaraZamora on the news and needed a diabetic eye exam; Pt states that she has never had any ocular issues  other than wearing specs; Pt states that she does not check blood sugars or remember her A1C, pt states that she is currently taking metfomin and her doctor wants her to take insulin but she is resisting;  Reports she is "uncontroled" diabetic; Pt states her PCP is Leodis SiasFrancis Wong;   Referring physician: Ileana LaddWong, Francis P, MD [address] [place], [place] [id number]  HISTORICAL INFORMATION:   Selected notes from the MEDICAL RECORD NUMBER Referred by self for DM exam;  LEE-  Ocular Hx-  PMH- type II DM (last A1C: 11.7), HTN, hyperlipidemia    CURRENT MEDICATIONS: No current outpatient medications on file. (Ophthalmic Drugs)   No current facility-administered medications for this visit.  (Ophthalmic Drugs)   Current Outpatient Medications (Other)  Medication Sig  . amLODipine (NORVASC) 10 MG tablet Take 1 tablet (10 mg total) by mouth daily.  Marland Kitchen. aspirin 81 MG tablet Take 81 mg by mouth every evening.   . carvedilol (COREG) 25 MG tablet TAKE 1 TABLET BY MOUTH TWICE A DAY  . Cetirizine HCl (ZYRTEC ALLERGY PO) Take by mouth.  . cloNIDine HCl (KAPVAY) 0.1 MG TB12 ER tablet Take 1 tablet (0.1 mg total) by mouth at bedtime.  Marland Kitchen. glipiZIDE (GLUCOTROL) 5 MG tablet TAKE 2 TABLETS BY MOUTH TWICE A DAY BEFORE A MEAL  . ipratropium (ATROVENT) 0.03 % nasal spray Place 2 sprays into the nose 4 (four) times daily.  Marland Kitchen. LORazepam (ATIVAN) 0.5 MG tablet  Take 1 tablet (0.5 mg total) by mouth at bedtime.  Marland Kitchen losartan-hydrochlorothiazide (HYZAAR) 100-25 MG tablet TAKE ONE TABLET BY MOUTH EACH DAY  . meloxicam (MOBIC) 15 MG tablet TAKE 1 TABLET BY MOUTH EVERY DAY  . metFORMIN (GLUCOPHAGE) 1000 MG tablet TAKE 1 TABLET BY MOUTH TWICE A DAY WITH A MEAL  . mometasone (NASONEX) 50 MCG/ACT nasal spray Place 2 sprays into the nose as needed.  . simvastatin (ZOCOR) 40 MG tablet Take 1 tablet (40 mg total) by mouth every evening.  . sitaGLIPtin (JANUVIA) 100 MG tablet Take 100 mg by mouth daily.  . traZODone (DESYREL) 50 MG  tablet One every night   No current facility-administered medications for this visit.  (Other)      REVIEW OF SYSTEMS: ROS    Positive for: Genitourinary, Endocrine, Eyes   Negative for: Constitutional, Gastrointestinal, Neurological, Skin, Musculoskeletal, HENT, Cardiovascular, Respiratory, Psychiatric, Allergic/Imm, Heme/Lymph   Last edited by Eldridge Scot, LPN on 16/02/9603  9:41 AM. (History)       ALLERGIES Allergies  Allergen Reactions  . Lisinopril Swelling  . Shrimp [Shellfish Allergy]     PAST MEDICAL HISTORY Past Medical History:  Diagnosis Date  . Allergy   . Diabetes mellitus   . Hyperlipidemia   . Hypersomnia, persistent 02/24/2013  . Hypertension   . Snoring disorder 02/24/2013   History reviewed. No pertinent surgical history.  FAMILY HISTORY Family History  Problem Relation Age of Onset  . Kidney disease Mother   . Heart disease Father   . Heart disease Sister     SOCIAL HISTORY Social History   Tobacco Use  . Smoking status: Never Smoker  . Smokeless tobacco: Never Used  Substance Use Topics  . Alcohol use: No    Alcohol/week: 0.0 oz  . Drug use: No         OPHTHALMIC EXAM:  Base Eye Exam    Visual Acuity (Snellen - Linear)      Right Left   Dist cc 20/20 20/20   Correction:  Glasses       Tonometry (Tonopen, 10:15 AM)      Right Left   Pressure 13 16       Pupils      Dark Shape APD   Right 4 Round None   Left 4 Round None       Visual Fields (Counting fingers)      Left Right    Full Full       Extraocular Movement      Right Left    Full, Ortho Full, Ortho       Neuro/Psych    Oriented x3:  Yes   Mood/Affect:  Normal       Dilation    Both eyes:  1.0% Mydriacyl, 2.5% Phenylephrine @ 10:15 AM        Slit Lamp and Fundus Exam    External Exam      Right Left   External Normal Normal       Slit Lamp Exam      Right Left   Lids/Lashes Dermatochalasis - upper lid Dermatochalasis - upper lid    Conjunctiva/Sclera White and quiet White and quiet   Cornea Clear Clear   Anterior Chamber Deep and quiet Deep and quiet   Iris Round and dilated, No NVI Round and dilated, No NVI   Lens 2+ Nuclear sclerosis, 2-3+ Cortical cataract 2+ Nuclear sclerosis, 2+ Cortical cataract   Vitreous Vitreous syneresis Vitreous syneresis  Fundus Exam      Right Left   Disc Normal, No NVD Normal, No NVD   C/D Ratio 0.3 0.4   Macula good foveal refelx, flat, no heme ot edema Flat, good foveal reflex, no heme or edema   Vessels mild copper wiring, mild AV crossing changes, No NV mild copper wiring, mild AV crossing changes, No NV   Periphery attached, no heme attached, no heme        Refraction    Wearing Rx      Sphere Cylinder Axis Add   Right -2.25 +0.50 148 +2.00   Left -2.25 +1.00 033 +2.00   Age:  60yrs       Manifest Refraction      Sphere Cylinder Axis Dist VA   Right -2.25 +0.75 168 20/20   Left -2.00 +0.25 033 20/20          IMAGING AND PROCEDURES  Imaging and Procedures for 03/30/17  OCT, Retina - OU - Both Eyes     Right Eye Quality was good. Central Foveal Thickness: 265. Progression has no prior data. Findings include no IRF, no SRF, normal foveal contour, vitreomacular adhesion .   Left Eye Quality was good. Central Foveal Thickness: 263. Progression has no prior data. Findings include normal foveal contour, no IRF, no SRF, vitreomacular adhesion .   Notes Images taken, stored on drive  Diagnosis / Impression:  No DME OU NFP, No IRF, No SRF, VMA OU;   Clinical management:  See below  Abbreviations: NFP - Normal foveal profile. CME - cystoid macular edema. PED - pigment epithelial detachment. IRF - intraretinal fluid. SRF - subretinal fluid. EZ - ellipsoid zone. ERM - epiretinal membrane. ORA - outer retinal atrophy. ORT - outer retinal tubulation. SRHM - subretinal hyper-reflective material                  ASSESSMENT/PLAN:    ICD-10-CM   1.  Diabetes mellitus type 2 without retinopathy (HCC) E11.9   2. Retinal edema H35.81 OCT, Retina - OU - Both Eyes  3. Hypertensive retinopathy of both eyes H35.033   4. Combined forms of age-related cataract of both eyes H25.813     1. Diabetes mellitus, type 2 without retinopathy - The incidence, risk factors for progression, natural history and treatment options for diabetic retinopathy  were discussed with patient.   - The need for close monitoring of blood glucose, blood pressure, and serum lipids, avoiding cigarette or any type of tobacco, and the need for long term follow up was also discussed with patient. - f/u in 1 year, sooner prn - letter to PCP, Dr. Leodis Sias  2. No diabetic macular edema or retinal edema  3. Hypertensive retinopathy OU - discussed importance of tight BP control - monitor  4. Combined forms of age-related cataract OU-  - The symptoms of cataract, surgical options, and treatments and risks were discussed with patient. - discussed diagnosis and progression - not yet visually significant - monitor for now   Ophthalmic Meds Ordered this visit:  No orders of the defined types were placed in this encounter.      Return in about 1 year (around 03/30/2018) for Dilated exam.  There are no Patient Instructions on file for this visit.   Explained the diagnoses, plan, and follow up with the patient and they expressed understanding.  Patient expressed understanding of the importance of proper follow up care.   Karie Chimera, M.D., Ph.D. Diseases & Surgery  of the Retina and Vitreous [facility] 03/30/17     Abbreviations: M myopia (nearsighted); A astigmatism; H hyperopia (farsighted); P presbyopia; Mrx spectacle prescription;  CTL contact lenses; OD right eye; OS left eye; OU both eyes  XT exotropia; ET esotropia; PEK punctate epithelial keratitis; PEE punctate epithelial erosions; DES dry eye syndrome; MGD meibomian gland  dysfunction; ATs artificial tears; PFAT's preservative free artificial tears; NSC nuclear sclerotic cataract; PSC posterior subcapsular cataract; ERM epi-retinal membrane; PVD posterior vitreous detachment; RD retinal detachment; DM diabetes mellitus; DR diabetic retinopathy; NPDR non-proliferative diabetic retinopathy; PDR proliferative diabetic retinopathy; CSME clinically significant macular edema; DME diabetic macular edema; dbh dot blot hemorrhages; CWS cotton wool spot; POAG primary open angle glaucoma; C/D cup-to-disc ratio; HVF humphrey visual field; GVF goldmann visual field; OCT optical coherence tomography; IOP intraocular pressure; BRVO Branch retinal vein occlusion; CRVO central retinal vein occlusion; CRAO central retinal artery occlusion; BRAO branch retinal artery occlusion; RT retinal tear; SB scleral buckle; PPV pars plana vitrectomy; VH Vitreous hemorrhage; PRP panretinal laser photocoagulation; IVK intravitreal kenalog; VMT vitreomacular traction; MH Macular hole;  NVD neovascularization of the disc; NVE neovascularization elsewhere; AREDS age related eye disease study; ARMD age related macular degeneration; POAG primary open angle glaucoma; EBMD epithelial/anterior basement membrane dystrophy; ACIOL anterior chamber intraocular lens; IOL intraocular lens; PCIOL posterior chamber intraocular lens; Phaco/IOL phacoemulsification with intraocular lens placement; PRK photorefractive keratectomy; LASIK laser assisted in situ keratomileusis; HTN hypertension; DM diabetes mellitus; COPD chronic obstructive pulmonary disease

## 2017-03-30 ENCOUNTER — Encounter (INDEPENDENT_AMBULATORY_CARE_PROVIDER_SITE_OTHER): Payer: Self-pay | Admitting: Ophthalmology

## 2017-03-30 ENCOUNTER — Ambulatory Visit (INDEPENDENT_AMBULATORY_CARE_PROVIDER_SITE_OTHER): Payer: Federal, State, Local not specified - PPO | Admitting: Ophthalmology

## 2017-03-30 DIAGNOSIS — H3581 Retinal edema: Secondary | ICD-10-CM | POA: Diagnosis not present

## 2017-03-30 DIAGNOSIS — H35033 Hypertensive retinopathy, bilateral: Secondary | ICD-10-CM

## 2017-03-30 DIAGNOSIS — E119 Type 2 diabetes mellitus without complications: Secondary | ICD-10-CM | POA: Diagnosis not present

## 2017-03-30 DIAGNOSIS — H25813 Combined forms of age-related cataract, bilateral: Secondary | ICD-10-CM | POA: Diagnosis not present

## 2017-05-12 DIAGNOSIS — I10 Essential (primary) hypertension: Secondary | ICD-10-CM | POA: Diagnosis not present

## 2017-05-12 DIAGNOSIS — E781 Pure hyperglyceridemia: Secondary | ICD-10-CM | POA: Diagnosis not present

## 2017-05-12 DIAGNOSIS — E1165 Type 2 diabetes mellitus with hyperglycemia: Secondary | ICD-10-CM | POA: Diagnosis not present

## 2017-05-12 DIAGNOSIS — R809 Proteinuria, unspecified: Secondary | ICD-10-CM | POA: Diagnosis not present

## 2017-06-22 DIAGNOSIS — I10 Essential (primary) hypertension: Secondary | ICD-10-CM | POA: Diagnosis not present

## 2017-06-22 DIAGNOSIS — E785 Hyperlipidemia, unspecified: Secondary | ICD-10-CM | POA: Diagnosis not present

## 2017-06-22 DIAGNOSIS — E119 Type 2 diabetes mellitus without complications: Secondary | ICD-10-CM | POA: Diagnosis not present

## 2017-07-26 ENCOUNTER — Encounter (INDEPENDENT_AMBULATORY_CARE_PROVIDER_SITE_OTHER): Payer: Federal, State, Local not specified - PPO | Admitting: Ophthalmology

## 2017-09-07 DIAGNOSIS — Z91013 Allergy to seafood: Secondary | ICD-10-CM | POA: Diagnosis not present

## 2017-09-07 DIAGNOSIS — J3081 Allergic rhinitis due to animal (cat) (dog) hair and dander: Secondary | ICD-10-CM | POA: Diagnosis not present

## 2017-09-07 DIAGNOSIS — H1045 Other chronic allergic conjunctivitis: Secondary | ICD-10-CM | POA: Diagnosis not present

## 2017-09-07 DIAGNOSIS — J301 Allergic rhinitis due to pollen: Secondary | ICD-10-CM | POA: Diagnosis not present

## 2017-09-09 DIAGNOSIS — I10 Essential (primary) hypertension: Secondary | ICD-10-CM | POA: Diagnosis not present

## 2017-09-09 DIAGNOSIS — E781 Pure hyperglyceridemia: Secondary | ICD-10-CM | POA: Diagnosis not present

## 2017-09-09 DIAGNOSIS — R809 Proteinuria, unspecified: Secondary | ICD-10-CM | POA: Diagnosis not present

## 2017-09-09 DIAGNOSIS — E1165 Type 2 diabetes mellitus with hyperglycemia: Secondary | ICD-10-CM | POA: Diagnosis not present

## 2017-09-10 DIAGNOSIS — J301 Allergic rhinitis due to pollen: Secondary | ICD-10-CM | POA: Diagnosis not present

## 2017-09-13 DIAGNOSIS — J3081 Allergic rhinitis due to animal (cat) (dog) hair and dander: Secondary | ICD-10-CM | POA: Diagnosis not present

## 2017-09-24 DIAGNOSIS — J301 Allergic rhinitis due to pollen: Secondary | ICD-10-CM | POA: Diagnosis not present

## 2017-09-24 DIAGNOSIS — J3081 Allergic rhinitis due to animal (cat) (dog) hair and dander: Secondary | ICD-10-CM | POA: Diagnosis not present

## 2017-09-28 DIAGNOSIS — J301 Allergic rhinitis due to pollen: Secondary | ICD-10-CM | POA: Diagnosis not present

## 2017-09-28 DIAGNOSIS — J3081 Allergic rhinitis due to animal (cat) (dog) hair and dander: Secondary | ICD-10-CM | POA: Diagnosis not present

## 2017-09-28 DIAGNOSIS — J3089 Other allergic rhinitis: Secondary | ICD-10-CM | POA: Diagnosis not present

## 2017-10-01 DIAGNOSIS — J301 Allergic rhinitis due to pollen: Secondary | ICD-10-CM | POA: Diagnosis not present

## 2017-10-01 DIAGNOSIS — J3081 Allergic rhinitis due to animal (cat) (dog) hair and dander: Secondary | ICD-10-CM | POA: Diagnosis not present

## 2017-10-06 DIAGNOSIS — J3081 Allergic rhinitis due to animal (cat) (dog) hair and dander: Secondary | ICD-10-CM | POA: Diagnosis not present

## 2017-10-06 DIAGNOSIS — J301 Allergic rhinitis due to pollen: Secondary | ICD-10-CM | POA: Diagnosis not present

## 2017-10-08 DIAGNOSIS — J3081 Allergic rhinitis due to animal (cat) (dog) hair and dander: Secondary | ICD-10-CM | POA: Diagnosis not present

## 2017-10-08 DIAGNOSIS — J301 Allergic rhinitis due to pollen: Secondary | ICD-10-CM | POA: Diagnosis not present

## 2017-10-14 DIAGNOSIS — J301 Allergic rhinitis due to pollen: Secondary | ICD-10-CM | POA: Diagnosis not present

## 2017-10-14 DIAGNOSIS — R05 Cough: Secondary | ICD-10-CM | POA: Diagnosis not present

## 2017-10-14 DIAGNOSIS — H1045 Other chronic allergic conjunctivitis: Secondary | ICD-10-CM | POA: Diagnosis not present

## 2017-10-14 DIAGNOSIS — J3081 Allergic rhinitis due to animal (cat) (dog) hair and dander: Secondary | ICD-10-CM | POA: Diagnosis not present

## 2017-10-20 DIAGNOSIS — J3081 Allergic rhinitis due to animal (cat) (dog) hair and dander: Secondary | ICD-10-CM | POA: Diagnosis not present

## 2017-10-20 DIAGNOSIS — J301 Allergic rhinitis due to pollen: Secondary | ICD-10-CM | POA: Diagnosis not present

## 2017-10-26 DIAGNOSIS — J301 Allergic rhinitis due to pollen: Secondary | ICD-10-CM | POA: Diagnosis not present

## 2017-10-26 DIAGNOSIS — J3081 Allergic rhinitis due to animal (cat) (dog) hair and dander: Secondary | ICD-10-CM | POA: Diagnosis not present

## 2017-11-02 DIAGNOSIS — J3089 Other allergic rhinitis: Secondary | ICD-10-CM | POA: Diagnosis not present

## 2017-11-02 DIAGNOSIS — J3081 Allergic rhinitis due to animal (cat) (dog) hair and dander: Secondary | ICD-10-CM | POA: Diagnosis not present

## 2017-11-02 DIAGNOSIS — J301 Allergic rhinitis due to pollen: Secondary | ICD-10-CM | POA: Diagnosis not present

## 2017-11-04 DIAGNOSIS — J301 Allergic rhinitis due to pollen: Secondary | ICD-10-CM | POA: Diagnosis not present

## 2017-11-04 DIAGNOSIS — J3081 Allergic rhinitis due to animal (cat) (dog) hair and dander: Secondary | ICD-10-CM | POA: Diagnosis not present

## 2017-11-08 DIAGNOSIS — J3081 Allergic rhinitis due to animal (cat) (dog) hair and dander: Secondary | ICD-10-CM | POA: Diagnosis not present

## 2017-11-08 DIAGNOSIS — J301 Allergic rhinitis due to pollen: Secondary | ICD-10-CM | POA: Diagnosis not present

## 2017-11-08 DIAGNOSIS — E119 Type 2 diabetes mellitus without complications: Secondary | ICD-10-CM | POA: Diagnosis not present

## 2017-11-08 DIAGNOSIS — E663 Overweight: Secondary | ICD-10-CM | POA: Diagnosis not present

## 2017-11-08 DIAGNOSIS — E785 Hyperlipidemia, unspecified: Secondary | ICD-10-CM | POA: Diagnosis not present

## 2017-11-08 DIAGNOSIS — I10 Essential (primary) hypertension: Secondary | ICD-10-CM | POA: Diagnosis not present

## 2017-11-10 DIAGNOSIS — J301 Allergic rhinitis due to pollen: Secondary | ICD-10-CM | POA: Diagnosis not present

## 2017-11-10 DIAGNOSIS — J3081 Allergic rhinitis due to animal (cat) (dog) hair and dander: Secondary | ICD-10-CM | POA: Diagnosis not present

## 2017-11-16 DIAGNOSIS — J301 Allergic rhinitis due to pollen: Secondary | ICD-10-CM | POA: Diagnosis not present

## 2017-11-16 DIAGNOSIS — J3081 Allergic rhinitis due to animal (cat) (dog) hair and dander: Secondary | ICD-10-CM | POA: Diagnosis not present

## 2017-11-19 DIAGNOSIS — J3081 Allergic rhinitis due to animal (cat) (dog) hair and dander: Secondary | ICD-10-CM | POA: Diagnosis not present

## 2017-11-19 DIAGNOSIS — J301 Allergic rhinitis due to pollen: Secondary | ICD-10-CM | POA: Diagnosis not present

## 2017-11-23 DIAGNOSIS — J301 Allergic rhinitis due to pollen: Secondary | ICD-10-CM | POA: Diagnosis not present

## 2017-11-23 DIAGNOSIS — J3081 Allergic rhinitis due to animal (cat) (dog) hair and dander: Secondary | ICD-10-CM | POA: Diagnosis not present

## 2017-11-26 DIAGNOSIS — J3081 Allergic rhinitis due to animal (cat) (dog) hair and dander: Secondary | ICD-10-CM | POA: Diagnosis not present

## 2017-11-26 DIAGNOSIS — J301 Allergic rhinitis due to pollen: Secondary | ICD-10-CM | POA: Diagnosis not present

## 2017-12-01 DIAGNOSIS — J3081 Allergic rhinitis due to animal (cat) (dog) hair and dander: Secondary | ICD-10-CM | POA: Diagnosis not present

## 2017-12-01 DIAGNOSIS — J301 Allergic rhinitis due to pollen: Secondary | ICD-10-CM | POA: Diagnosis not present

## 2017-12-07 DIAGNOSIS — J301 Allergic rhinitis due to pollen: Secondary | ICD-10-CM | POA: Diagnosis not present

## 2017-12-07 DIAGNOSIS — J3081 Allergic rhinitis due to animal (cat) (dog) hair and dander: Secondary | ICD-10-CM | POA: Diagnosis not present

## 2017-12-15 DIAGNOSIS — J3081 Allergic rhinitis due to animal (cat) (dog) hair and dander: Secondary | ICD-10-CM | POA: Diagnosis not present

## 2017-12-15 DIAGNOSIS — J301 Allergic rhinitis due to pollen: Secondary | ICD-10-CM | POA: Diagnosis not present

## 2017-12-22 DIAGNOSIS — J301 Allergic rhinitis due to pollen: Secondary | ICD-10-CM | POA: Diagnosis not present

## 2017-12-22 DIAGNOSIS — J3081 Allergic rhinitis due to animal (cat) (dog) hair and dander: Secondary | ICD-10-CM | POA: Diagnosis not present

## 2017-12-29 DIAGNOSIS — J301 Allergic rhinitis due to pollen: Secondary | ICD-10-CM | POA: Diagnosis not present

## 2017-12-29 DIAGNOSIS — J3081 Allergic rhinitis due to animal (cat) (dog) hair and dander: Secondary | ICD-10-CM | POA: Diagnosis not present

## 2018-01-06 DIAGNOSIS — H6123 Impacted cerumen, bilateral: Secondary | ICD-10-CM | POA: Diagnosis not present

## 2018-01-06 DIAGNOSIS — R42 Dizziness and giddiness: Secondary | ICD-10-CM | POA: Diagnosis not present

## 2018-01-07 DIAGNOSIS — H1045 Other chronic allergic conjunctivitis: Secondary | ICD-10-CM | POA: Diagnosis not present

## 2018-01-07 DIAGNOSIS — J301 Allergic rhinitis due to pollen: Secondary | ICD-10-CM | POA: Diagnosis not present

## 2018-01-07 DIAGNOSIS — R05 Cough: Secondary | ICD-10-CM | POA: Diagnosis not present

## 2018-01-07 DIAGNOSIS — J3081 Allergic rhinitis due to animal (cat) (dog) hair and dander: Secondary | ICD-10-CM | POA: Diagnosis not present

## 2018-01-12 DIAGNOSIS — J3081 Allergic rhinitis due to animal (cat) (dog) hair and dander: Secondary | ICD-10-CM | POA: Diagnosis not present

## 2018-01-12 DIAGNOSIS — J301 Allergic rhinitis due to pollen: Secondary | ICD-10-CM | POA: Diagnosis not present

## 2018-01-19 DIAGNOSIS — E781 Pure hyperglyceridemia: Secondary | ICD-10-CM | POA: Diagnosis not present

## 2018-01-19 DIAGNOSIS — R809 Proteinuria, unspecified: Secondary | ICD-10-CM | POA: Diagnosis not present

## 2018-01-19 DIAGNOSIS — Z23 Encounter for immunization: Secondary | ICD-10-CM | POA: Diagnosis not present

## 2018-01-19 DIAGNOSIS — E1165 Type 2 diabetes mellitus with hyperglycemia: Secondary | ICD-10-CM | POA: Diagnosis not present

## 2018-01-19 DIAGNOSIS — I10 Essential (primary) hypertension: Secondary | ICD-10-CM | POA: Diagnosis not present

## 2018-01-21 DIAGNOSIS — J301 Allergic rhinitis due to pollen: Secondary | ICD-10-CM | POA: Diagnosis not present

## 2018-01-21 DIAGNOSIS — J3081 Allergic rhinitis due to animal (cat) (dog) hair and dander: Secondary | ICD-10-CM | POA: Diagnosis not present

## 2018-01-28 DIAGNOSIS — J3081 Allergic rhinitis due to animal (cat) (dog) hair and dander: Secondary | ICD-10-CM | POA: Diagnosis not present

## 2018-01-28 DIAGNOSIS — J301 Allergic rhinitis due to pollen: Secondary | ICD-10-CM | POA: Diagnosis not present

## 2018-02-04 DIAGNOSIS — H9313 Tinnitus, bilateral: Secondary | ICD-10-CM | POA: Diagnosis not present

## 2018-02-04 DIAGNOSIS — J3081 Allergic rhinitis due to animal (cat) (dog) hair and dander: Secondary | ICD-10-CM | POA: Diagnosis not present

## 2018-02-04 DIAGNOSIS — J301 Allergic rhinitis due to pollen: Secondary | ICD-10-CM | POA: Diagnosis not present

## 2018-02-04 DIAGNOSIS — H903 Sensorineural hearing loss, bilateral: Secondary | ICD-10-CM | POA: Diagnosis not present

## 2018-02-04 DIAGNOSIS — H6123 Impacted cerumen, bilateral: Secondary | ICD-10-CM | POA: Diagnosis not present

## 2018-02-11 DIAGNOSIS — J3081 Allergic rhinitis due to animal (cat) (dog) hair and dander: Secondary | ICD-10-CM | POA: Diagnosis not present

## 2018-02-11 DIAGNOSIS — J301 Allergic rhinitis due to pollen: Secondary | ICD-10-CM | POA: Diagnosis not present

## 2018-02-18 DIAGNOSIS — J3081 Allergic rhinitis due to animal (cat) (dog) hair and dander: Secondary | ICD-10-CM | POA: Diagnosis not present

## 2018-02-18 DIAGNOSIS — J301 Allergic rhinitis due to pollen: Secondary | ICD-10-CM | POA: Diagnosis not present

## 2018-02-18 DIAGNOSIS — Z1231 Encounter for screening mammogram for malignant neoplasm of breast: Secondary | ICD-10-CM | POA: Diagnosis not present

## 2018-02-25 DIAGNOSIS — J301 Allergic rhinitis due to pollen: Secondary | ICD-10-CM | POA: Diagnosis not present

## 2018-02-25 DIAGNOSIS — J3081 Allergic rhinitis due to animal (cat) (dog) hair and dander: Secondary | ICD-10-CM | POA: Diagnosis not present

## 2018-03-04 DIAGNOSIS — J3081 Allergic rhinitis due to animal (cat) (dog) hair and dander: Secondary | ICD-10-CM | POA: Diagnosis not present

## 2018-03-04 DIAGNOSIS — J301 Allergic rhinitis due to pollen: Secondary | ICD-10-CM | POA: Diagnosis not present

## 2018-03-09 DIAGNOSIS — R05 Cough: Secondary | ICD-10-CM | POA: Diagnosis not present

## 2018-03-09 DIAGNOSIS — H1045 Other chronic allergic conjunctivitis: Secondary | ICD-10-CM | POA: Diagnosis not present

## 2018-03-09 DIAGNOSIS — J3081 Allergic rhinitis due to animal (cat) (dog) hair and dander: Secondary | ICD-10-CM | POA: Diagnosis not present

## 2018-03-09 DIAGNOSIS — J301 Allergic rhinitis due to pollen: Secondary | ICD-10-CM | POA: Diagnosis not present

## 2018-03-15 ENCOUNTER — Encounter (INDEPENDENT_AMBULATORY_CARE_PROVIDER_SITE_OTHER): Payer: Federal, State, Local not specified - PPO | Admitting: Ophthalmology

## 2018-03-18 DIAGNOSIS — J301 Allergic rhinitis due to pollen: Secondary | ICD-10-CM | POA: Diagnosis not present

## 2018-03-18 DIAGNOSIS — J3081 Allergic rhinitis due to animal (cat) (dog) hair and dander: Secondary | ICD-10-CM | POA: Diagnosis not present

## 2018-03-25 DIAGNOSIS — J3081 Allergic rhinitis due to animal (cat) (dog) hair and dander: Secondary | ICD-10-CM | POA: Diagnosis not present

## 2018-03-25 DIAGNOSIS — J301 Allergic rhinitis due to pollen: Secondary | ICD-10-CM | POA: Diagnosis not present

## 2018-04-01 DIAGNOSIS — J301 Allergic rhinitis due to pollen: Secondary | ICD-10-CM | POA: Diagnosis not present

## 2018-04-01 DIAGNOSIS — J3081 Allergic rhinitis due to animal (cat) (dog) hair and dander: Secondary | ICD-10-CM | POA: Diagnosis not present

## 2018-04-06 DIAGNOSIS — J301 Allergic rhinitis due to pollen: Secondary | ICD-10-CM | POA: Diagnosis not present

## 2018-04-06 DIAGNOSIS — J3089 Other allergic rhinitis: Secondary | ICD-10-CM | POA: Diagnosis not present

## 2018-04-12 DIAGNOSIS — E119 Type 2 diabetes mellitus without complications: Secondary | ICD-10-CM | POA: Diagnosis not present

## 2018-04-12 DIAGNOSIS — E785 Hyperlipidemia, unspecified: Secondary | ICD-10-CM | POA: Diagnosis not present

## 2018-04-12 DIAGNOSIS — I10 Essential (primary) hypertension: Secondary | ICD-10-CM | POA: Diagnosis not present

## 2018-04-12 DIAGNOSIS — E663 Overweight: Secondary | ICD-10-CM | POA: Diagnosis not present

## 2018-04-13 DIAGNOSIS — J301 Allergic rhinitis due to pollen: Secondary | ICD-10-CM | POA: Diagnosis not present

## 2018-04-13 DIAGNOSIS — J3081 Allergic rhinitis due to animal (cat) (dog) hair and dander: Secondary | ICD-10-CM | POA: Diagnosis not present

## 2018-04-14 DIAGNOSIS — J301 Allergic rhinitis due to pollen: Secondary | ICD-10-CM | POA: Diagnosis not present

## 2018-04-14 DIAGNOSIS — J3081 Allergic rhinitis due to animal (cat) (dog) hair and dander: Secondary | ICD-10-CM | POA: Diagnosis not present

## 2018-04-22 DIAGNOSIS — J301 Allergic rhinitis due to pollen: Secondary | ICD-10-CM | POA: Diagnosis not present

## 2018-04-22 DIAGNOSIS — J3081 Allergic rhinitis due to animal (cat) (dog) hair and dander: Secondary | ICD-10-CM | POA: Diagnosis not present

## 2018-04-27 DIAGNOSIS — J3081 Allergic rhinitis due to animal (cat) (dog) hair and dander: Secondary | ICD-10-CM | POA: Diagnosis not present

## 2018-04-27 DIAGNOSIS — J301 Allergic rhinitis due to pollen: Secondary | ICD-10-CM | POA: Diagnosis not present

## 2018-05-05 DIAGNOSIS — J3081 Allergic rhinitis due to animal (cat) (dog) hair and dander: Secondary | ICD-10-CM | POA: Diagnosis not present

## 2018-05-05 DIAGNOSIS — J301 Allergic rhinitis due to pollen: Secondary | ICD-10-CM | POA: Diagnosis not present

## 2018-05-16 DIAGNOSIS — J301 Allergic rhinitis due to pollen: Secondary | ICD-10-CM | POA: Diagnosis not present

## 2018-05-16 DIAGNOSIS — J3081 Allergic rhinitis due to animal (cat) (dog) hair and dander: Secondary | ICD-10-CM | POA: Diagnosis not present

## 2018-05-20 DIAGNOSIS — J3081 Allergic rhinitis due to animal (cat) (dog) hair and dander: Secondary | ICD-10-CM | POA: Diagnosis not present

## 2018-05-20 DIAGNOSIS — J301 Allergic rhinitis due to pollen: Secondary | ICD-10-CM | POA: Diagnosis not present

## 2018-05-26 DIAGNOSIS — I10 Essential (primary) hypertension: Secondary | ICD-10-CM | POA: Diagnosis not present

## 2018-05-26 DIAGNOSIS — E1165 Type 2 diabetes mellitus with hyperglycemia: Secondary | ICD-10-CM | POA: Diagnosis not present

## 2018-05-26 DIAGNOSIS — E781 Pure hyperglyceridemia: Secondary | ICD-10-CM | POA: Diagnosis not present

## 2018-05-26 DIAGNOSIS — R809 Proteinuria, unspecified: Secondary | ICD-10-CM | POA: Diagnosis not present

## 2018-06-12 DIAGNOSIS — J069 Acute upper respiratory infection, unspecified: Secondary | ICD-10-CM | POA: Diagnosis not present

## 2018-06-13 DIAGNOSIS — J3081 Allergic rhinitis due to animal (cat) (dog) hair and dander: Secondary | ICD-10-CM | POA: Diagnosis not present

## 2018-06-13 DIAGNOSIS — J301 Allergic rhinitis due to pollen: Secondary | ICD-10-CM | POA: Diagnosis not present

## 2018-07-19 DIAGNOSIS — E785 Hyperlipidemia, unspecified: Secondary | ICD-10-CM | POA: Diagnosis not present

## 2018-07-19 DIAGNOSIS — J101 Influenza due to other identified influenza virus with other respiratory manifestations: Secondary | ICD-10-CM | POA: Diagnosis not present

## 2018-07-19 DIAGNOSIS — I10 Essential (primary) hypertension: Secondary | ICD-10-CM | POA: Diagnosis not present

## 2018-07-19 DIAGNOSIS — J0110 Acute frontal sinusitis, unspecified: Secondary | ICD-10-CM | POA: Diagnosis not present

## 2018-08-04 DIAGNOSIS — J069 Acute upper respiratory infection, unspecified: Secondary | ICD-10-CM | POA: Diagnosis not present

## 2018-08-04 DIAGNOSIS — Z20828 Contact with and (suspected) exposure to other viral communicable diseases: Secondary | ICD-10-CM | POA: Diagnosis not present

## 2018-10-06 DIAGNOSIS — E11649 Type 2 diabetes mellitus with hypoglycemia without coma: Secondary | ICD-10-CM | POA: Diagnosis not present

## 2018-10-06 DIAGNOSIS — E785 Hyperlipidemia, unspecified: Secondary | ICD-10-CM | POA: Diagnosis not present

## 2018-10-06 DIAGNOSIS — I10 Essential (primary) hypertension: Secondary | ICD-10-CM | POA: Diagnosis not present

## 2018-10-06 DIAGNOSIS — L853 Xerosis cutis: Secondary | ICD-10-CM | POA: Diagnosis not present

## 2018-10-06 DIAGNOSIS — E781 Pure hyperglyceridemia: Secondary | ICD-10-CM | POA: Diagnosis not present

## 2018-10-06 DIAGNOSIS — E1165 Type 2 diabetes mellitus with hyperglycemia: Secondary | ICD-10-CM | POA: Diagnosis not present

## 2018-10-06 DIAGNOSIS — R809 Proteinuria, unspecified: Secondary | ICD-10-CM | POA: Diagnosis not present

## 2018-10-07 DIAGNOSIS — I10 Essential (primary) hypertension: Secondary | ICD-10-CM | POA: Diagnosis not present

## 2018-10-07 DIAGNOSIS — E11649 Type 2 diabetes mellitus with hypoglycemia without coma: Secondary | ICD-10-CM | POA: Diagnosis not present

## 2018-10-07 DIAGNOSIS — E785 Hyperlipidemia, unspecified: Secondary | ICD-10-CM | POA: Diagnosis not present

## 2019-02-02 DIAGNOSIS — I10 Essential (primary) hypertension: Secondary | ICD-10-CM | POA: Diagnosis not present

## 2019-02-02 DIAGNOSIS — R809 Proteinuria, unspecified: Secondary | ICD-10-CM | POA: Diagnosis not present

## 2019-02-02 DIAGNOSIS — E781 Pure hyperglyceridemia: Secondary | ICD-10-CM | POA: Diagnosis not present

## 2019-02-02 DIAGNOSIS — Z23 Encounter for immunization: Secondary | ICD-10-CM | POA: Diagnosis not present

## 2019-02-02 DIAGNOSIS — E1165 Type 2 diabetes mellitus with hyperglycemia: Secondary | ICD-10-CM | POA: Diagnosis not present

## 2019-02-09 DIAGNOSIS — E11649 Type 2 diabetes mellitus with hypoglycemia without coma: Secondary | ICD-10-CM | POA: Diagnosis not present

## 2019-02-09 DIAGNOSIS — E785 Hyperlipidemia, unspecified: Secondary | ICD-10-CM | POA: Diagnosis not present

## 2019-02-09 DIAGNOSIS — L853 Xerosis cutis: Secondary | ICD-10-CM | POA: Diagnosis not present

## 2019-02-09 DIAGNOSIS — I10 Essential (primary) hypertension: Secondary | ICD-10-CM | POA: Diagnosis not present

## 2019-02-20 DIAGNOSIS — E785 Hyperlipidemia, unspecified: Secondary | ICD-10-CM | POA: Diagnosis not present

## 2019-02-20 DIAGNOSIS — I10 Essential (primary) hypertension: Secondary | ICD-10-CM | POA: Diagnosis not present

## 2019-02-20 DIAGNOSIS — E11649 Type 2 diabetes mellitus with hypoglycemia without coma: Secondary | ICD-10-CM | POA: Diagnosis not present

## 2019-03-10 DIAGNOSIS — Z91013 Allergy to seafood: Secondary | ICD-10-CM | POA: Diagnosis not present

## 2019-03-10 DIAGNOSIS — J3081 Allergic rhinitis due to animal (cat) (dog) hair and dander: Secondary | ICD-10-CM | POA: Diagnosis not present

## 2019-03-10 DIAGNOSIS — J301 Allergic rhinitis due to pollen: Secondary | ICD-10-CM | POA: Diagnosis not present

## 2019-03-10 DIAGNOSIS — H1045 Other chronic allergic conjunctivitis: Secondary | ICD-10-CM | POA: Diagnosis not present

## 2019-03-14 DIAGNOSIS — M25511 Pain in right shoulder: Secondary | ICD-10-CM | POA: Diagnosis not present

## 2019-03-14 DIAGNOSIS — M7531 Calcific tendinitis of right shoulder: Secondary | ICD-10-CM | POA: Diagnosis not present

## 2019-03-14 DIAGNOSIS — Z1231 Encounter for screening mammogram for malignant neoplasm of breast: Secondary | ICD-10-CM | POA: Diagnosis not present

## 2019-03-14 DIAGNOSIS — E119 Type 2 diabetes mellitus without complications: Secondary | ICD-10-CM | POA: Diagnosis not present

## 2019-04-03 DIAGNOSIS — M75101 Unspecified rotator cuff tear or rupture of right shoulder, not specified as traumatic: Secondary | ICD-10-CM | POA: Diagnosis not present

## 2019-04-03 DIAGNOSIS — M25511 Pain in right shoulder: Secondary | ICD-10-CM | POA: Diagnosis not present

## 2019-05-15 DIAGNOSIS — M7712 Lateral epicondylitis, left elbow: Secondary | ICD-10-CM | POA: Diagnosis not present

## 2019-05-15 DIAGNOSIS — M25511 Pain in right shoulder: Secondary | ICD-10-CM | POA: Diagnosis not present

## 2019-05-27 DIAGNOSIS — M25511 Pain in right shoulder: Secondary | ICD-10-CM | POA: Diagnosis not present

## 2019-06-08 DIAGNOSIS — M7501 Adhesive capsulitis of right shoulder: Secondary | ICD-10-CM | POA: Diagnosis not present

## 2019-06-08 DIAGNOSIS — M25511 Pain in right shoulder: Secondary | ICD-10-CM | POA: Diagnosis not present

## 2019-09-15 DIAGNOSIS — J3081 Allergic rhinitis due to animal (cat) (dog) hair and dander: Secondary | ICD-10-CM | POA: Diagnosis not present

## 2019-09-15 DIAGNOSIS — J301 Allergic rhinitis due to pollen: Secondary | ICD-10-CM | POA: Diagnosis not present

## 2019-09-15 DIAGNOSIS — H1045 Other chronic allergic conjunctivitis: Secondary | ICD-10-CM | POA: Diagnosis not present

## 2019-09-15 DIAGNOSIS — Z91013 Allergy to seafood: Secondary | ICD-10-CM | POA: Diagnosis not present

## 2019-09-29 DIAGNOSIS — M7501 Adhesive capsulitis of right shoulder: Secondary | ICD-10-CM | POA: Diagnosis not present

## 2019-09-29 DIAGNOSIS — M25511 Pain in right shoulder: Secondary | ICD-10-CM | POA: Diagnosis not present

## 2019-10-11 DIAGNOSIS — M7501 Adhesive capsulitis of right shoulder: Secondary | ICD-10-CM | POA: Diagnosis not present

## 2020-01-29 DIAGNOSIS — I10 Essential (primary) hypertension: Secondary | ICD-10-CM | POA: Diagnosis not present

## 2020-01-29 DIAGNOSIS — E785 Hyperlipidemia, unspecified: Secondary | ICD-10-CM | POA: Diagnosis not present

## 2020-01-29 DIAGNOSIS — Z23 Encounter for immunization: Secondary | ICD-10-CM | POA: Diagnosis not present

## 2020-01-29 DIAGNOSIS — M7531 Calcific tendinitis of right shoulder: Secondary | ICD-10-CM | POA: Diagnosis not present

## 2020-01-29 DIAGNOSIS — E11649 Type 2 diabetes mellitus with hypoglycemia without coma: Secondary | ICD-10-CM | POA: Diagnosis not present

## 2020-03-14 DIAGNOSIS — Z1231 Encounter for screening mammogram for malignant neoplasm of breast: Secondary | ICD-10-CM | POA: Diagnosis not present

## 2020-03-26 DIAGNOSIS — R922 Inconclusive mammogram: Secondary | ICD-10-CM | POA: Diagnosis not present

## 2020-03-26 DIAGNOSIS — R928 Other abnormal and inconclusive findings on diagnostic imaging of breast: Secondary | ICD-10-CM | POA: Diagnosis not present

## 2020-04-25 DIAGNOSIS — I10 Essential (primary) hypertension: Secondary | ICD-10-CM | POA: Diagnosis not present

## 2020-04-25 DIAGNOSIS — R809 Proteinuria, unspecified: Secondary | ICD-10-CM | POA: Diagnosis not present

## 2020-04-25 DIAGNOSIS — E781 Pure hyperglyceridemia: Secondary | ICD-10-CM | POA: Diagnosis not present

## 2020-04-25 DIAGNOSIS — E1165 Type 2 diabetes mellitus with hyperglycemia: Secondary | ICD-10-CM | POA: Diagnosis not present

## 2020-06-06 DIAGNOSIS — R197 Diarrhea, unspecified: Secondary | ICD-10-CM | POA: Diagnosis not present

## 2020-06-06 DIAGNOSIS — R0981 Nasal congestion: Secondary | ICD-10-CM | POA: Diagnosis not present

## 2020-06-06 DIAGNOSIS — R5383 Other fatigue: Secondary | ICD-10-CM | POA: Diagnosis not present

## 2020-06-06 DIAGNOSIS — Z20822 Contact with and (suspected) exposure to covid-19: Secondary | ICD-10-CM | POA: Diagnosis not present

## 2020-07-05 DIAGNOSIS — I10 Essential (primary) hypertension: Secondary | ICD-10-CM | POA: Diagnosis not present

## 2020-07-05 DIAGNOSIS — E785 Hyperlipidemia, unspecified: Secondary | ICD-10-CM | POA: Diagnosis not present

## 2020-07-05 DIAGNOSIS — E11649 Type 2 diabetes mellitus with hypoglycemia without coma: Secondary | ICD-10-CM | POA: Diagnosis not present

## 2020-07-05 DIAGNOSIS — U099 Post covid-19 condition, unspecified: Secondary | ICD-10-CM | POA: Diagnosis not present

## 2020-07-11 DIAGNOSIS — I10 Essential (primary) hypertension: Secondary | ICD-10-CM | POA: Diagnosis not present

## 2020-07-11 DIAGNOSIS — E1165 Type 2 diabetes mellitus with hyperglycemia: Secondary | ICD-10-CM | POA: Diagnosis not present

## 2020-07-11 DIAGNOSIS — R809 Proteinuria, unspecified: Secondary | ICD-10-CM | POA: Diagnosis not present

## 2020-07-11 DIAGNOSIS — E781 Pure hyperglyceridemia: Secondary | ICD-10-CM | POA: Diagnosis not present

## 2020-08-02 DIAGNOSIS — U099 Post covid-19 condition, unspecified: Secondary | ICD-10-CM | POA: Diagnosis not present

## 2020-08-02 DIAGNOSIS — E11649 Type 2 diabetes mellitus with hypoglycemia without coma: Secondary | ICD-10-CM | POA: Diagnosis not present

## 2020-08-02 DIAGNOSIS — E785 Hyperlipidemia, unspecified: Secondary | ICD-10-CM | POA: Diagnosis not present

## 2020-08-02 DIAGNOSIS — I10 Essential (primary) hypertension: Secondary | ICD-10-CM | POA: Diagnosis not present

## 2020-08-02 DIAGNOSIS — S20211A Contusion of right front wall of thorax, initial encounter: Secondary | ICD-10-CM | POA: Diagnosis not present

## 2020-09-09 DIAGNOSIS — Z91013 Allergy to seafood: Secondary | ICD-10-CM | POA: Diagnosis not present

## 2020-09-09 DIAGNOSIS — J301 Allergic rhinitis due to pollen: Secondary | ICD-10-CM | POA: Diagnosis not present

## 2020-09-09 DIAGNOSIS — J3081 Allergic rhinitis due to animal (cat) (dog) hair and dander: Secondary | ICD-10-CM | POA: Diagnosis not present

## 2020-09-09 DIAGNOSIS — H1045 Other chronic allergic conjunctivitis: Secondary | ICD-10-CM | POA: Diagnosis not present

## 2020-10-15 DIAGNOSIS — J301 Allergic rhinitis due to pollen: Secondary | ICD-10-CM | POA: Diagnosis not present

## 2020-10-16 DIAGNOSIS — J3081 Allergic rhinitis due to animal (cat) (dog) hair and dander: Secondary | ICD-10-CM | POA: Diagnosis not present

## 2020-11-18 DIAGNOSIS — E1165 Type 2 diabetes mellitus with hyperglycemia: Secondary | ICD-10-CM | POA: Diagnosis not present

## 2020-11-18 DIAGNOSIS — R809 Proteinuria, unspecified: Secondary | ICD-10-CM | POA: Diagnosis not present

## 2020-11-18 DIAGNOSIS — E781 Pure hyperglyceridemia: Secondary | ICD-10-CM | POA: Diagnosis not present

## 2020-11-18 DIAGNOSIS — I10 Essential (primary) hypertension: Secondary | ICD-10-CM | POA: Diagnosis not present

## 2020-11-19 DIAGNOSIS — J3081 Allergic rhinitis due to animal (cat) (dog) hair and dander: Secondary | ICD-10-CM | POA: Diagnosis not present

## 2020-11-19 DIAGNOSIS — J301 Allergic rhinitis due to pollen: Secondary | ICD-10-CM | POA: Diagnosis not present

## 2020-11-21 DIAGNOSIS — J3081 Allergic rhinitis due to animal (cat) (dog) hair and dander: Secondary | ICD-10-CM | POA: Diagnosis not present

## 2020-11-21 DIAGNOSIS — J301 Allergic rhinitis due to pollen: Secondary | ICD-10-CM | POA: Diagnosis not present

## 2020-12-03 DIAGNOSIS — E11649 Type 2 diabetes mellitus with hypoglycemia without coma: Secondary | ICD-10-CM | POA: Diagnosis not present

## 2020-12-03 DIAGNOSIS — E785 Hyperlipidemia, unspecified: Secondary | ICD-10-CM | POA: Diagnosis not present

## 2020-12-03 DIAGNOSIS — I10 Essential (primary) hypertension: Secondary | ICD-10-CM | POA: Diagnosis not present

## 2020-12-12 DIAGNOSIS — R5383 Other fatigue: Secondary | ICD-10-CM | POA: Diagnosis not present

## 2020-12-12 DIAGNOSIS — L853 Xerosis cutis: Secondary | ICD-10-CM | POA: Diagnosis not present

## 2020-12-12 DIAGNOSIS — I10 Essential (primary) hypertension: Secondary | ICD-10-CM | POA: Diagnosis not present

## 2021-03-15 DIAGNOSIS — R519 Headache, unspecified: Secondary | ICD-10-CM | POA: Diagnosis not present

## 2021-03-15 DIAGNOSIS — Z03818 Encounter for observation for suspected exposure to other biological agents ruled out: Secondary | ICD-10-CM | POA: Diagnosis not present

## 2021-03-20 DIAGNOSIS — Z1231 Encounter for screening mammogram for malignant neoplasm of breast: Secondary | ICD-10-CM | POA: Diagnosis not present

## 2021-03-25 DIAGNOSIS — E785 Hyperlipidemia, unspecified: Secondary | ICD-10-CM | POA: Diagnosis not present

## 2021-03-25 DIAGNOSIS — E781 Pure hyperglyceridemia: Secondary | ICD-10-CM | POA: Diagnosis not present

## 2021-03-25 DIAGNOSIS — R809 Proteinuria, unspecified: Secondary | ICD-10-CM | POA: Diagnosis not present

## 2021-03-25 DIAGNOSIS — E11649 Type 2 diabetes mellitus with hypoglycemia without coma: Secondary | ICD-10-CM | POA: Diagnosis not present

## 2021-03-25 DIAGNOSIS — I10 Essential (primary) hypertension: Secondary | ICD-10-CM | POA: Diagnosis not present

## 2021-03-25 DIAGNOSIS — G43909 Migraine, unspecified, not intractable, without status migrainosus: Secondary | ICD-10-CM | POA: Diagnosis not present

## 2021-03-25 DIAGNOSIS — E1165 Type 2 diabetes mellitus with hyperglycemia: Secondary | ICD-10-CM | POA: Diagnosis not present

## 2021-03-26 DIAGNOSIS — E785 Hyperlipidemia, unspecified: Secondary | ICD-10-CM | POA: Diagnosis not present

## 2021-03-26 DIAGNOSIS — H04123 Dry eye syndrome of bilateral lacrimal glands: Secondary | ICD-10-CM | POA: Diagnosis not present

## 2021-05-05 DIAGNOSIS — E119 Type 2 diabetes mellitus without complications: Secondary | ICD-10-CM | POA: Diagnosis not present

## 2021-09-10 DIAGNOSIS — J301 Allergic rhinitis due to pollen: Secondary | ICD-10-CM | POA: Diagnosis not present

## 2021-09-10 DIAGNOSIS — H1045 Other chronic allergic conjunctivitis: Secondary | ICD-10-CM | POA: Diagnosis not present

## 2021-09-10 DIAGNOSIS — J3081 Allergic rhinitis due to animal (cat) (dog) hair and dander: Secondary | ICD-10-CM | POA: Diagnosis not present

## 2021-09-10 DIAGNOSIS — Z91013 Allergy to seafood: Secondary | ICD-10-CM | POA: Diagnosis not present

## 2021-09-16 DIAGNOSIS — E0590 Thyrotoxicosis, unspecified without thyrotoxic crisis or storm: Secondary | ICD-10-CM | POA: Diagnosis not present

## 2021-09-16 DIAGNOSIS — E1165 Type 2 diabetes mellitus with hyperglycemia: Secondary | ICD-10-CM | POA: Diagnosis not present

## 2021-09-16 DIAGNOSIS — E781 Pure hyperglyceridemia: Secondary | ICD-10-CM | POA: Diagnosis not present

## 2021-09-16 DIAGNOSIS — R809 Proteinuria, unspecified: Secondary | ICD-10-CM | POA: Diagnosis not present

## 2021-09-22 DIAGNOSIS — E785 Hyperlipidemia, unspecified: Secondary | ICD-10-CM | POA: Diagnosis not present

## 2021-09-22 DIAGNOSIS — U099 Post covid-19 condition, unspecified: Secondary | ICD-10-CM | POA: Diagnosis not present

## 2021-09-22 DIAGNOSIS — E11649 Type 2 diabetes mellitus with hypoglycemia without coma: Secondary | ICD-10-CM | POA: Diagnosis not present

## 2021-09-22 DIAGNOSIS — H9313 Tinnitus, bilateral: Secondary | ICD-10-CM | POA: Diagnosis not present

## 2021-09-22 DIAGNOSIS — I10 Essential (primary) hypertension: Secondary | ICD-10-CM | POA: Diagnosis not present

## 2021-09-23 DIAGNOSIS — I10 Essential (primary) hypertension: Secondary | ICD-10-CM | POA: Diagnosis not present

## 2021-09-23 DIAGNOSIS — R809 Proteinuria, unspecified: Secondary | ICD-10-CM | POA: Diagnosis not present

## 2021-09-23 DIAGNOSIS — E1165 Type 2 diabetes mellitus with hyperglycemia: Secondary | ICD-10-CM | POA: Diagnosis not present

## 2021-09-23 DIAGNOSIS — E781 Pure hyperglyceridemia: Secondary | ICD-10-CM | POA: Diagnosis not present

## 2022-01-07 DIAGNOSIS — H6123 Impacted cerumen, bilateral: Secondary | ICD-10-CM | POA: Diagnosis not present

## 2022-01-07 DIAGNOSIS — H9313 Tinnitus, bilateral: Secondary | ICD-10-CM | POA: Diagnosis not present

## 2022-01-07 DIAGNOSIS — H903 Sensorineural hearing loss, bilateral: Secondary | ICD-10-CM | POA: Diagnosis not present

## 2022-02-19 DIAGNOSIS — E1165 Type 2 diabetes mellitus with hyperglycemia: Secondary | ICD-10-CM | POA: Diagnosis not present

## 2022-02-19 DIAGNOSIS — R809 Proteinuria, unspecified: Secondary | ICD-10-CM | POA: Diagnosis not present

## 2022-02-19 DIAGNOSIS — E781 Pure hyperglyceridemia: Secondary | ICD-10-CM | POA: Diagnosis not present

## 2022-02-19 DIAGNOSIS — I10 Essential (primary) hypertension: Secondary | ICD-10-CM | POA: Diagnosis not present

## 2022-03-26 DIAGNOSIS — E1165 Type 2 diabetes mellitus with hyperglycemia: Secondary | ICD-10-CM | POA: Diagnosis not present

## 2022-03-26 DIAGNOSIS — Z6826 Body mass index (BMI) 26.0-26.9, adult: Secondary | ICD-10-CM | POA: Diagnosis not present

## 2022-03-26 DIAGNOSIS — Z5181 Encounter for therapeutic drug level monitoring: Secondary | ICD-10-CM | POA: Diagnosis not present

## 2022-03-26 DIAGNOSIS — I10 Essential (primary) hypertension: Secondary | ICD-10-CM | POA: Diagnosis not present

## 2022-03-26 DIAGNOSIS — E785 Hyperlipidemia, unspecified: Secondary | ICD-10-CM | POA: Diagnosis not present

## 2022-04-14 DIAGNOSIS — Z1231 Encounter for screening mammogram for malignant neoplasm of breast: Secondary | ICD-10-CM | POA: Diagnosis not present

## 2022-05-05 DIAGNOSIS — E119 Type 2 diabetes mellitus without complications: Secondary | ICD-10-CM | POA: Diagnosis not present

## 2022-08-17 DIAGNOSIS — E1165 Type 2 diabetes mellitus with hyperglycemia: Secondary | ICD-10-CM | POA: Diagnosis not present

## 2022-08-17 DIAGNOSIS — E0590 Thyrotoxicosis, unspecified without thyrotoxic crisis or storm: Secondary | ICD-10-CM | POA: Diagnosis not present

## 2022-08-17 DIAGNOSIS — R809 Proteinuria, unspecified: Secondary | ICD-10-CM | POA: Diagnosis not present

## 2022-08-17 DIAGNOSIS — E781 Pure hyperglyceridemia: Secondary | ICD-10-CM | POA: Diagnosis not present

## 2022-08-21 DIAGNOSIS — E781 Pure hyperglyceridemia: Secondary | ICD-10-CM | POA: Diagnosis not present

## 2022-08-21 DIAGNOSIS — R809 Proteinuria, unspecified: Secondary | ICD-10-CM | POA: Diagnosis not present

## 2022-08-21 DIAGNOSIS — E1165 Type 2 diabetes mellitus with hyperglycemia: Secondary | ICD-10-CM | POA: Diagnosis not present

## 2022-08-21 DIAGNOSIS — I10 Essential (primary) hypertension: Secondary | ICD-10-CM | POA: Diagnosis not present

## 2022-09-11 DIAGNOSIS — J3081 Allergic rhinitis due to animal (cat) (dog) hair and dander: Secondary | ICD-10-CM | POA: Diagnosis not present

## 2022-09-11 DIAGNOSIS — H1045 Other chronic allergic conjunctivitis: Secondary | ICD-10-CM | POA: Diagnosis not present

## 2022-09-11 DIAGNOSIS — Z91013 Allergy to seafood: Secondary | ICD-10-CM | POA: Diagnosis not present

## 2022-09-11 DIAGNOSIS — J301 Allergic rhinitis due to pollen: Secondary | ICD-10-CM | POA: Diagnosis not present

## 2022-10-08 DIAGNOSIS — E785 Hyperlipidemia, unspecified: Secondary | ICD-10-CM | POA: Diagnosis not present

## 2022-10-08 DIAGNOSIS — I10 Essential (primary) hypertension: Secondary | ICD-10-CM | POA: Diagnosis not present

## 2022-10-08 DIAGNOSIS — Z23 Encounter for immunization: Secondary | ICD-10-CM | POA: Diagnosis not present

## 2022-10-08 DIAGNOSIS — Z0000 Encounter for general adult medical examination without abnormal findings: Secondary | ICD-10-CM | POA: Diagnosis not present

## 2022-10-08 DIAGNOSIS — E11649 Type 2 diabetes mellitus with hypoglycemia without coma: Secondary | ICD-10-CM | POA: Diagnosis not present

## 2022-10-08 DIAGNOSIS — Z124 Encounter for screening for malignant neoplasm of cervix: Secondary | ICD-10-CM | POA: Diagnosis not present

## 2022-11-20 DIAGNOSIS — R809 Proteinuria, unspecified: Secondary | ICD-10-CM | POA: Diagnosis not present

## 2022-11-20 DIAGNOSIS — E1165 Type 2 diabetes mellitus with hyperglycemia: Secondary | ICD-10-CM | POA: Diagnosis not present

## 2022-11-24 ENCOUNTER — Emergency Department (HOSPITAL_BASED_OUTPATIENT_CLINIC_OR_DEPARTMENT_OTHER): Payer: Federal, State, Local not specified - PPO

## 2022-11-24 ENCOUNTER — Emergency Department (HOSPITAL_BASED_OUTPATIENT_CLINIC_OR_DEPARTMENT_OTHER)
Admission: EM | Admit: 2022-11-24 | Discharge: 2022-11-24 | Disposition: A | Discharge status: Home / Self Care | Payer: Federal, State, Local not specified - PPO | Attending: Emergency Medicine | Admitting: Emergency Medicine

## 2022-11-24 ENCOUNTER — Encounter (HOSPITAL_BASED_OUTPATIENT_CLINIC_OR_DEPARTMENT_OTHER): Payer: Self-pay

## 2022-11-24 ENCOUNTER — Other Ambulatory Visit: Payer: Self-pay

## 2022-11-24 DIAGNOSIS — H532 Diplopia: Secondary | ICD-10-CM | POA: Insufficient documentation

## 2022-11-24 DIAGNOSIS — Z7984 Long term (current) use of oral hypoglycemic drugs: Secondary | ICD-10-CM | POA: Diagnosis not present

## 2022-11-24 DIAGNOSIS — E119 Type 2 diabetes mellitus without complications: Secondary | ICD-10-CM | POA: Insufficient documentation

## 2022-11-24 DIAGNOSIS — Z7982 Long term (current) use of aspirin: Secondary | ICD-10-CM | POA: Insufficient documentation

## 2022-11-24 DIAGNOSIS — Z79899 Other long term (current) drug therapy: Secondary | ICD-10-CM | POA: Insufficient documentation

## 2022-11-24 DIAGNOSIS — I10 Essential (primary) hypertension: Secondary | ICD-10-CM | POA: Diagnosis not present

## 2022-11-24 DIAGNOSIS — R930 Abnormal findings on diagnostic imaging of skull and head, not elsewhere classified: Secondary | ICD-10-CM | POA: Diagnosis not present

## 2022-11-24 DIAGNOSIS — R519 Headache, unspecified: Secondary | ICD-10-CM | POA: Diagnosis not present

## 2022-11-24 LAB — DIFFERENTIAL
Abs Immature Granulocytes: 0.04 10*3/uL (ref 0.00–0.07)
Basophils Absolute: 0 10*3/uL (ref 0.0–0.1)
Basophils Relative: 1 %
Eosinophils Absolute: 0.3 10*3/uL (ref 0.0–0.5)
Eosinophils Relative: 4 %
Immature Granulocytes: 1 %
Lymphocytes Relative: 31 %
Lymphs Abs: 1.9 10*3/uL (ref 0.7–4.0)
Monocytes Absolute: 0.5 10*3/uL (ref 0.1–1.0)
Monocytes Relative: 8 %
Neutro Abs: 3.4 10*3/uL (ref 1.7–7.7)
Neutrophils Relative %: 55 %

## 2022-11-24 LAB — PROTIME-INR
INR: 0.9 (ref 0.8–1.2)
Prothrombin Time: 12.6 seconds (ref 11.4–15.2)

## 2022-11-24 LAB — COMPREHENSIVE METABOLIC PANEL
ALT: 33 U/L (ref 0–44)
AST: 28 U/L (ref 15–41)
Albumin: 4.5 g/dL (ref 3.5–5.0)
Alkaline Phosphatase: 41 U/L (ref 38–126)
Anion gap: 11 (ref 5–15)
BUN: 32 mg/dL — ABNORMAL HIGH (ref 8–23)
CO2: 24 mmol/L (ref 22–32)
Calcium: 10.1 mg/dL (ref 8.9–10.3)
Chloride: 106 mmol/L (ref 98–111)
Creatinine, Ser: 1.12 mg/dL — ABNORMAL HIGH (ref 0.44–1.00)
GFR, Estimated: 55 mL/min — ABNORMAL LOW (ref >60)
Glucose, Bld: 111 mg/dL — ABNORMAL HIGH (ref 70–99)
Potassium: 3.8 mmol/L (ref 3.5–5.1)
Sodium: 141 mmol/L (ref 135–145)
Total Bilirubin: 0.5 mg/dL (ref 0.3–1.2)
Total Protein: 8.3 g/dL — ABNORMAL HIGH (ref 6.5–8.1)

## 2022-11-24 LAB — TSH: TSH: 2.257 u[IU]/mL (ref 0.350–4.500)

## 2022-11-24 LAB — CBC
HCT: 43.2 % (ref 36.0–46.0)
Hemoglobin: 14.5 g/dL (ref 12.0–15.0)
MCH: 27.5 pg (ref 26.0–34.0)
MCHC: 33.6 g/dL (ref 30.0–36.0)
MCV: 82 fL (ref 80.0–100.0)
Platelets: 212 10*3/uL (ref 150–400)
RBC: 5.27 MIL/uL — ABNORMAL HIGH (ref 3.87–5.11)
RDW: 12.7 % (ref 11.5–15.5)
WBC: 6.1 10*3/uL (ref 4.0–10.5)
nRBC: 0 % (ref 0.0–0.2)

## 2022-11-24 LAB — APTT: aPTT: 31 seconds (ref 24–36)

## 2022-11-24 MED ORDER — IOHEXOL 350 MG/ML SOLN
100.0000 mL | Freq: Once | INTRAVENOUS | Status: AC | PRN
  Administered 2022-11-24: 75 mL via INTRAVENOUS

## 2022-11-24 NOTE — ED Notes (Signed)
Pt aware of the need for a urine... Unable to currently provide a urine... 

## 2022-11-24 NOTE — ED Provider Notes (Signed)
[place] EMERGENCY DEPARTMENT AT [facility] Provider Note   CSN: [id number] Arrival date & time: 11/24/22  1231     History  Chief Complaint  Patient presents with   Eye Problem    Nancy Evans is a 62 y.o. female with past medical history hypertension, hyperlipidemia, diabetes who presents to the ED complaining of double vision that started 4 days ago.  She states that when both eyes are open she sees 2 side-by-side objects or when should be.  When she closes either eye, this resolved.  Associated mild, dull headache to the back of her head that started the same day.  No vision field cuts, loss of vision, chest pain, shortness of breath, paresthesias, focal weakness, or other complaints.  At baseline, patient does use glasses with last eye exam about 1 year ago without prescription change.      Home Medications Prior to Admission medications   Medication Sig Start Date End Date Taking? Authorizing Provider  amLODipine (NORVASC) 10 MG tablet Take 1 tablet (10 mg total) by mouth daily. 02/22/15   Elvina Sidle, MD  aspirin 81 MG tablet Take 81 mg by mouth every evening.     [provider]  carvedilol (COREG) 25 MG tablet TAKE 1 TABLET BY MOUTH TWICE A DAY 05/15/16   Weber, Sarah L, PA-C  Cetirizine HCl (ZYRTEC ALLERGY PO) Take by mouth.    [provider]  cloNIDine HCl (KAPVAY) 0.1 MG TB12 ER tablet Take 1 tablet (0.1 mg total) by mouth at bedtime. 02/22/15   Elvina Sidle, MD  glipiZIDE (GLUCOTROL) 5 MG tablet TAKE 2 TABLETS BY MOUTH TWICE A DAY BEFORE A MEAL 11/25/15   Jeffery, Chelle, PA  ipratropium (ATROVENT) 0.03 % nasal spray Place 2 sprays into the nose 4 (four) times daily. 02/22/15   Elvina Sidle, MD  LORazepam (ATIVAN) 0.5 MG tablet Take 1 tablet (0.5 mg total) by mouth at bedtime. 09/18/15   Elvina Sidle, MD  losartan-hydrochlorothiazide (HYZAAR) 100-25 MG tablet TAKE ONE TABLET BY MOUTH EACH DAY 03/30/16   Porfirio Oar, PA   meloxicam (MOBIC) 15 MG tablet TAKE 1 TABLET BY MOUTH EVERY DAY 12/19/15   Weber, Dema Severin, PA-C  metFORMIN (GLUCOPHAGE) 1000 MG tablet TAKE 1 TABLET BY MOUTH TWICE A DAY WITH A MEAL 06/03/16   Ofilia Neas, PA-C  mometasone (NASONEX) 50 MCG/ACT nasal spray Place 2 sprays into the nose as needed. 02/22/15   Elvina Sidle, MD  simvastatin (ZOCOR) 40 MG tablet Take 1 tablet (40 mg total) by mouth every evening. 11/13/16   Porfirio Oar, PA  sitaGLIPtin (JANUVIA) 100 MG tablet Take 100 mg by mouth daily.    [provider]  traZODone (DESYREL) 50 MG tablet One every night 04/25/15   Elvina Sidle, MD      Allergies    Lisinopril and Shrimp [shellfish allergy]    Review of Systems   Review of Systems  All other systems reviewed and are negative.   Physical Exam Updated Vital Signs BP (!) 159/81   Pulse (!) 56   Temp 98.4 F (36.9 C) (Oral)   Resp 18   Ht 4\' 11"  (1.499 m)   Wt 57.6 kg   SpO2 94%   BMI 25.65 kg/m  Physical Exam Vitals and nursing note reviewed.  Constitutional:      General: She is not in acute distress.    Appearance: Normal appearance. She is not ill-appearing or toxic-appearing.  HENT:     Head:  Normocephalic and atraumatic.     Mouth/Throat:     Mouth: Mucous membranes are moist.  Eyes:     General: Lids are normal. Vision grossly intact. Gaze aligned appropriately. No scleral icterus.       Right eye: No discharge.        Left eye: No discharge.     Extraocular Movements: Extraocular movements intact.     Right eye: No nystagmus.     Left eye: No nystagmus.     Conjunctiva/sclera: Conjunctivae normal.     Pupils: Pupils are equal, round, and reactive to light.     Funduscopic exam:    Right eye: No AV nicking or papilledema. Red reflex present.        Left eye: No AV nicking or papilledema. Red reflex present.    Comments: No proptosis  Cardiovascular:     Rate and Rhythm: Normal rate and regular rhythm.     Heart sounds: No murmur  heard. Pulmonary:     Effort: Pulmonary effort is normal.     Breath sounds: Normal breath sounds.  Abdominal:     General: Abdomen is flat.     Palpations: Abdomen is soft.     Tenderness: There is no abdominal tenderness.  Musculoskeletal:        General: Normal range of motion.     Cervical back: Normal range of motion and neck supple. No rigidity or tenderness.     Right lower leg: No edema.     Left lower leg: No edema.  Skin:    General: Skin is warm and dry.     Capillary Refill: Capillary refill takes less than 2 seconds.  Neurological:     General: No focal deficit present.     Mental Status: She is alert and oriented to person, place, and time.     Cranial Nerves: No dysarthria or facial asymmetry.     Sensory: Sensation is intact.     Motor: Motor function is intact. No weakness, tremor, atrophy, abnormal muscle tone or seizure activity.     Coordination: Coordination is intact.     Comments: Intact apart from reported diplopia with both eyes open  Psychiatric:        Behavior: Behavior normal.     ED Results / Procedures / Treatments   Labs (all labs ordered are listed, but only abnormal results are displayed) Labs Reviewed  CBC - Abnormal; Notable for the following components:      Result Value   RBC 5.27 (*)    All other components within normal limits  COMPREHENSIVE METABOLIC PANEL - Abnormal; Notable for the following components:   Glucose, Bld 111 (*)    BUN 32 (*)    Creatinine, Ser 1.12 (*)    Total Protein 8.3 (*)    GFR, Estimated 55 (*)    All other components within normal limits  PROTIME-INR  APTT  DIFFERENTIAL  TSH  ETHANOL    EKG EKG Interpretation Date/Time:  Tuesday November 24 2022 13:30:39 EDT Ventricular Rate:  61 PR Interval:  173 QRS Duration:  80 QT Interval:  426 QTC Calculation: 430 R Axis:   39  Text Interpretation: Sinus rhythm Probable anteroseptal infarct, recent Confirmed by Coralee Pesa [phone]) on 11/24/2022 4:31:35  PM  Radiology CT Angio Head W or Wo Contrast  Result Date: 11/24/2022 CLINICAL DATA:  binocular diplopia EXAM: CT ANGIOGRAPHY HEAD TECHNIQUE: Multidetector CT imaging of the head was performed using the standard protocol during bolus  administration of intravenous contrast. Multiplanar CT image reconstructions and MIPs were obtained to evaluate the vascular anatomy. RADIATION DOSE REDUCTION: This exam was performed according to the departmental dose-optimization program which includes automated exposure control, adjustment of the mA and/or kV according to patient size and/or use of iterative reconstruction technique. CONTRAST:  75mL OMNIPAQUE IOHEXOL 350 MG/ML SOLN COMPARISON:  None Available. FINDINGS: CT HEAD Brain: No evidence of acute infarction, hemorrhage, hydrocephalus, extra-axial collection or mass lesion/mass effect. Enlarged and partially empty sella. Vascular: See below Skull: Normal. Negative for fracture or focal lesion. Sinuses: No middle ear or mastoid effusion. Paranasal sinuses are clear. Orbits are unremarkable. Other: None. CTA HEAD Anterior circulation: No significant stenosis, proximal occlusion, aneurysm, or vascular malformation. Posterior circulation: No significant stenosis, proximal occlusion, aneurysm, or vascular malformation. Venous sinuses: As permitted by contrast timing, patent. Anatomic variants: None Review of the MIP images confirms the above findings. IMPRESSION: 1. No acute intracranial abnormality. 2. No intracranial large vessel occlusion, significant stenosis, or aneurysm. 3. Enlarged and partially empty sella, which is nonspecific but can be seen in the setting of idiopathic intracranial hypertension. Electronically Signed   By: Lorenza Cambridge M.D.   On: 11/24/2022 15:29    Procedures Procedures   Visual Acuity Bilateral Distance: 20/25 R Distance: 20/25 L Distance: 20/40    Medications Ordered in ED Medications  iohexol (OMNIPAQUE) 350 MG/ML injection 100 mL  (75 mLs Intravenous Contrast Given 11/24/22 1434)    ED Course/ Medical Decision Making/ A&P                             Medical Decision Making Amount and/or Complexity of Data Reviewed Labs: ordered. Decision-making details documented in ED Course. Radiology: ordered. Decision-making details documented in ED Course.  Risk Prescription drug management.   Medical Decision Making:   Rhya Shan is a 62 y.o. female who presented to the ED today with diplopia detailed above.    Patient's presentation is complicated by their history of HTN, DM, HLD.  Complete initial physical exam performed, notably the patient was with grossly intact neurologic exam.  Nontoxic-appearing.  No meningismus. Reviewed and confirmed nursing documentation for past medical history, family history, social history.    Initial Assessment:   With the patient's presentation, differential diagnosis includes but is not limited to subarachnoid hemorrhage, meningitis, temporal arteritis, glaucoma, cerebral ischemia, carotid/vertebral dissection, intracranial tumor, Venous sinus thrombosis, carbon monoxide poisoning, acute or chronic subdural hemorrhage.  Other considerations include: Migraine, Cluster headache, Hypertension, Caffeine, alcohol, or drug withdrawal, Pseudotumor cerebri, Arteriovenous malformation, Head injury, Neurocysticercosis, Post-lumbar puncture, Preeclampsia, Tension headache, Sinusitis, Cervical arthritis, Refractive error causing strain, Dental abscess, Otitis media, Temporomandibular joint syndrome, Depression, Somatoform disorder (eg, somatization) Trigeminal neuralgia, Glossopharyngeal neuralgia.   This is most consistent with an acute complicated illness  Initial Plan:  Screening labs including CBC and Metabolic panel to evaluate for infectious or metabolic etiology of disease.  Urinalysis with reflex culture ordered to evaluate for UTI or relevant urologic/nephrologic pathology.  EKG to  evaluate for cardiac pathology Ethanol, PT/INR, APTT, UDS as part of stroke workup CTA head and neck with binocular diplopia TSH to rule out hyperthyroidism UA/UDS canceled as patient has not been able to provide sample.  She does not have any urinary symptoms. Objective evaluation as below reviewed   Initial Study Results:   Laboratory  All laboratory results reviewed without evidence of clinically relevant pathology.   Exceptions include: Creatinine 1.12,  GFR 55  EKG EKG was reviewed independently.  Normal sinus rhythm.  No acute ST-T changes.  No STEMI.  Radiology:  All images reviewed independently. Agree with radiology report at this time.   CT Angio Head W or Wo Contrast  Result Date: 11/24/2022 CLINICAL DATA:  binocular diplopia EXAM: CT ANGIOGRAPHY HEAD TECHNIQUE: Multidetector CT imaging of the head was performed using the standard protocol during bolus administration of intravenous contrast. Multiplanar CT image reconstructions and MIPs were obtained to evaluate the vascular anatomy. RADIATION DOSE REDUCTION: This exam was performed according to the departmental dose-optimization program which includes automated exposure control, adjustment of the mA and/or kV according to patient size and/or use of iterative reconstruction technique. CONTRAST:  75mL OMNIPAQUE IOHEXOL 350 MG/ML SOLN COMPARISON:  None Available. FINDINGS: CT HEAD Brain: No evidence of acute infarction, hemorrhage, hydrocephalus, extra-axial collection or mass lesion/mass effect. Enlarged and partially empty sella. Vascular: See below Skull: Normal. Negative for fracture or focal lesion. Sinuses: No middle ear or mastoid effusion. Paranasal sinuses are clear. Orbits are unremarkable. Other: None. CTA HEAD Anterior circulation: No significant stenosis, proximal occlusion, aneurysm, or vascular malformation. Posterior circulation: No significant stenosis, proximal occlusion, aneurysm, or vascular malformation. Venous  sinuses: As permitted by contrast timing, patent. Anatomic variants: None Review of the MIP images confirms the above findings. IMPRESSION: 1. No acute intracranial abnormality. 2. No intracranial large vessel occlusion, significant stenosis, or aneurysm. 3. Enlarged and partially empty sella, which is nonspecific but can be seen in the setting of idiopathic intracranial hypertension. Electronically Signed   By: Lorenza Cambridge M.D.   On: 11/24/2022 15:29      Consults: Case discussed with Dr. Selina Cooley with neurology who recommended outpatient neuro follow-up.   Final Assessment and Plan:   62 year old female presents to the ED complaining of bilateral diplopia and a dull, moderate headache onset about 4 days ago.  Overall notes that headache has remained about the same and vision seems to be improving.  Workup initiated as above for further assessment.  Besides noted diplopia which improves with closing of 1 eye, patient has a nonfocal and intact neuroexam.  She is slightly hypertensive but otherwise vital signs reassuring.  Afebrile, no neck pain.  No recent infectious symptoms.  EKG normal sinus rhythm without acute ST-T changes.  No significant electrolyte disturbance.  No proptosis on exam.  Pupils equal, round, and reactive to light.  Extraocular movements intact.  Cranial nerves grossly intact.  No meningismus. Nontoxic appearing. She does have a slight creatinine elevation at 1.12 but appears to have a baseline of around 0.8-0.9.  No recent volume losses.  Do not suspect acute renal pathology.  TSH are within normal limits.  CT obtained for further assessment which shows an enlarged and partially empty sella with possibility of IIH.  Patient does not have severe or positional headache.  Discussed with on-call neurology and patient presentation inconsistent with IIH but she does recommend close follow-up outpatient with neurology for MRI scan and further evaluation.  Discussed this in detail with patient and  she is agreeable to follow-up with both neurology and eye outpatient.  Discussed strict ED return precautions, all questions answered, and patient stable for discharge.   Clinical Impression:  1. Binocular vision disorder with diplopia      Discharge           Final Clinical Impression(s) / ED Diagnoses Final diagnoses:  Binocular vision disorder with diplopia    Rx / DC Orders ED Discharge  Orders          Ordered    Ambulatory referral to Neurology       Comments: An appointment is requested in approximately: 1 week   11/24/22 1745              Tonette Lederer, PA-C 11/24/22 1752    Melene Plan, DO 11/25/22 [phone]

## 2022-11-24 NOTE — ED Triage Notes (Addendum)
Patient here POV from Home.  Endorses Double Vision that has been consistent for three or four days and has actually improved today. No New Blurriness.   No Other Symptoms besides mild headache.   NAD Noted during Triage. A&Ox4. GCS 15. Ambulatory.

## 2022-11-24 NOTE — Discharge Instructions (Signed)
Thank you for letting us take care of you today.  Overall your workup was reassuring.  The CT scan of your head did show an incidental finding that symptoms we see in a condition called idiopathic intracranial hypertension.  We discussed this with the neurologist or brain specialist and they recommended close outpatient follow-up in the clinic with both neurology and also with ophthalmology or the eye specialist.  It is important to do so as you will need an MRI scan to rule out other disorders that may affect your vision long-term.  I referred you to neurology.  You should receive a call within 72 hours to set up an outpatient appointment.  If you do not hear from them, call their office to schedule this appointment.  I provided you with information for the eye specialist on-call, Dr. Essie Hart.  Please call his office to schedule a follow-up appointment.  You may also follow-up with your regular eye doctor if you so desire.  For new or worsening condition including severe headache, further deteriorating vision, weakness on one side of your body, numbness or tingling, chest pain, shortness of breath, or other new, concerning symptoms return to the nearest ED for reevaluation.

## 2022-11-24 NOTE — ED Notes (Signed)
Pt verbalized understanding of d/c instructions, meds, and followup care. Denies questions. VSS, no distress noted. Steady gait to exit with all belongings.  ?

## 2022-11-24 NOTE — ED Notes (Signed)
No current double vision, blurry or eye sight problem.Marland KitchenMarland Kitchen

## 2022-11-25 ENCOUNTER — Encounter: Payer: Self-pay | Admitting: Neurology

## 2022-11-25 DIAGNOSIS — H35033 Hypertensive retinopathy, bilateral: Secondary | ICD-10-CM | POA: Diagnosis not present

## 2022-11-25 DIAGNOSIS — H532 Diplopia: Secondary | ICD-10-CM | POA: Diagnosis not present

## 2022-11-25 DIAGNOSIS — H43823 Vitreomacular adhesion, bilateral: Secondary | ICD-10-CM | POA: Diagnosis not present

## 2022-11-27 DIAGNOSIS — R809 Proteinuria, unspecified: Secondary | ICD-10-CM | POA: Diagnosis not present

## 2022-11-27 DIAGNOSIS — E1165 Type 2 diabetes mellitus with hyperglycemia: Secondary | ICD-10-CM | POA: Diagnosis not present

## 2022-11-27 DIAGNOSIS — E781 Pure hyperglyceridemia: Secondary | ICD-10-CM | POA: Diagnosis not present

## 2022-11-27 DIAGNOSIS — I10 Essential (primary) hypertension: Secondary | ICD-10-CM | POA: Diagnosis not present

## 2022-12-07 DIAGNOSIS — E1165 Type 2 diabetes mellitus with hyperglycemia: Secondary | ICD-10-CM | POA: Diagnosis not present

## 2022-12-07 DIAGNOSIS — R809 Proteinuria, unspecified: Secondary | ICD-10-CM | POA: Diagnosis not present

## 2022-12-07 DIAGNOSIS — I10 Essential (primary) hypertension: Secondary | ICD-10-CM | POA: Diagnosis not present

## 2022-12-07 DIAGNOSIS — E781 Pure hyperglyceridemia: Secondary | ICD-10-CM | POA: Diagnosis not present

## 2022-12-09 ENCOUNTER — Ambulatory Visit: Payer: Federal, State, Local not specified - PPO | Admitting: Neurology

## 2022-12-09 ENCOUNTER — Other Ambulatory Visit (INDEPENDENT_AMBULATORY_CARE_PROVIDER_SITE_OTHER): Payer: Federal, State, Local not specified - PPO

## 2022-12-09 ENCOUNTER — Encounter: Payer: Self-pay | Admitting: Neurology

## 2022-12-09 VITALS — BP 120/76 | HR 68 | Ht 59.0 in | Wt 129.0 lb

## 2022-12-09 DIAGNOSIS — H02401 Unspecified ptosis of right eyelid: Secondary | ICD-10-CM | POA: Diagnosis not present

## 2022-12-09 DIAGNOSIS — R519 Headache, unspecified: Secondary | ICD-10-CM | POA: Diagnosis not present

## 2022-12-09 DIAGNOSIS — H532 Diplopia: Secondary | ICD-10-CM

## 2022-12-09 NOTE — Progress Notes (Signed)
[facility] Neurology [facility] Clinic Note - Initial Visit   Date: 12/09/2022   Nancy Evans MRN: [id number] DOB: [birth year]   Dear Dr. Atha Starks:  Thank you for your kind referral of Nancy Evans for consultation of double vision. Although her history is well known to you, please allow [place] to reiterate it for the purpose of our medical record. The patient was accompanied to the clinic by self.    Nancy Evans is a 62 y.o. right-handed female with hypertension, hyperlipidemia, and diabetes mellitus (HbA1c 9.4) presenting for evaluation of diplopia.   IMPRESSION/PLAN: Chronic daily headache - new.  No evidence of vascular abnormality on CTA head Diplopia, improving but still with end-gaze. No fatigability to her symptoms.  Exam shows trace right ptosis, which seems to be old when compared to drivers license picture. Will evaluate for myasthenia and consider LP to evaluate intracranial pressure  Partially empty sella, no evidence of papilledema on exam. However with her headaches, idiopathic intracranial hypertension is considered.   PLAN: - MRI brain wwo contrast - Consider lumbar puncture to evaluate opening pressure - Check myasthenia gravis antibody  Return to clinic in 6 weeks  ------------------------------------------------------------- History of present illness: Starting around April/May, she began having a dull headache, over the back of the head and occasionally over the forehead.  Pain is daily.  No nausea, vomiting, photophobia, or phonophobia.  On 7/13, she began noticing double vision with images on top of each other.  Double vision improved with one eye closure.  No problems with swallowing/speech or weakness. No numbness/tingling. For the past few days, she has not noticed the double vision. Her vision remains impaired if she looks far to either side.    Out-side paper records, electronic medical record, and images have been reviewed where available  and summarized as:  CTA head wwo contrast 11/24/2022: 1. No acute intracranial abnormality. 2. No intracranial large vessel occlusion, significant stenosis, or aneurysm. 3. Enlarged and partially empty sella, which is nonspecific but can be seen in the setting of idiopathic intracranial hypertension.  Lab Results  Component Value Date   HGBA1C 11.7 (H) 10/22/2015   No results found for: "VITAMINB12" Lab Results  Component Value Date   TSH 2.257 11/24/2022   No results found for: "ESRSEDRATE", "POCTSEDRATE"  Past Medical History:  Diagnosis Date   Allergy    Diabetes mellitus    Hyperlipidemia    Hypersomnia, persistent 02/24/2013   Hypertension    Snoring disorder 02/24/2013    History reviewed. No pertinent surgical history.   Medications:  Outpatient Encounter Medications as of 12/09/2022  Medication Sig Note   amLODipine (NORVASC) 10 MG tablet Take 1 tablet (10 mg total) by mouth daily.    aspirin 81 MG tablet Take 81 mg by mouth every evening.     azelastine (OPTIVAR) 0.05 % ophthalmic solution Apply to eye.    carvedilol (COREG) 25 MG tablet TAKE 1 TABLET BY MOUTH TWICE A DAY    Cetirizine HCl (ZYRTEC ALLERGY PO) Take by mouth.    cloNIDine HCl (KAPVAY) 0.1 MG TB12 ER tablet Take 1 tablet (0.1 mg total) by mouth at bedtime.    EPINEPHrine 0.3 mg/0.3 mL IJ SOAJ injection     galantamine (RAZADYNE) 12 MG tablet     Insulin Aspart FlexPen (NOVOLOG) 100 UNIT/ML Inject into the skin.    JARDIANCE 10 MG TABS tablet     Liver Extract (LIVER PO) Take by mouth.    losartan-hydrochlorothiazide (HYZAAR) 100-25 MG  tablet TAKE ONE TABLET BY MOUTH EACH DAY    Magnesium 250 MG TABS 1 tablet with a meal Orally Once a day    metFORMIN (GLUCOPHAGE) 1000 MG tablet TAKE 1 TABLET BY MOUTH TWICE A DAY WITH A MEAL    mometasone (NASONEX) 50 MCG/ACT nasal spray Place 2 sprays into the nose as needed. 06/12/2015: prn   montelukast (SINGULAIR) 10 MG tablet     omega-3 acid ethyl esters  (LOVAZA) 1 g capsule Take by mouth 2 (two) times daily.    simvastatin (ZOCOR) 40 MG tablet Take 1 tablet (40 mg total) by mouth every evening.    sitaGLIPtin (JANUVIA) 100 MG tablet Take 100 mg by mouth daily.    Turmeric 500 MG CAPS as directed Orally once a day    [DISCONTINUED] glipiZIDE (GLUCOTROL) 5 MG tablet TAKE 2 TABLETS BY MOUTH TWICE A DAY BEFORE A MEAL    [DISCONTINUED] ipratropium (ATROVENT) 0.03 % nasal spray Place 2 sprays into the nose 4 (four) times daily.    [DISCONTINUED] LORazepam (ATIVAN) 0.5 MG tablet Take 1 tablet (0.5 mg total) by mouth at bedtime.    [DISCONTINUED] meloxicam (MOBIC) 15 MG tablet TAKE 1 TABLET BY MOUTH EVERY DAY (Patient not taking: Reported on 12/09/2022)    [DISCONTINUED] traZODone (DESYREL) 50 MG tablet One every night    No facility-administered encounter medications on file as of 12/09/2022.    Allergies:  Allergies  Allergen Reactions   Lisinopril Swelling   Shrimp [Shellfish Allergy]     Family History: Family History  Problem Relation Age of Onset   Kidney disease Mother    Heart disease Father    Heart attack Father    Heart disease Sister    Heart attack Sister    Heart attack Sister     Social History: Social History   Tobacco Use   Smoking status: Never   Smokeless tobacco: Never  Vaping Use   Vaping status: Never Used  Substance Use Topics   Alcohol use: No    Alcohol/week: 0.0 standard drinks of alcohol   Drug use: No   Social History   Social History Narrative   Widowed 03/2015 after 30 years of marriage. She found him on the floor in their bedroom when she returned home from work. Autopsy revealed he had relapsed (crack cocaine use).   Her adult son lives with her.   Her daughter and her family live in [place], [place].         Are you right handed or left handed? Right Handed    Are you currently employed ? 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 Yes.     Who lives with you? Her dog    What type of home do you live in: 1 story or 2 story? One story         Vital Signs:  BP 120/76   Pulse 68   Ht 4\' 11"  (1.499 m)   Wt 129 lb (58.5 kg)   SpO2 98%   BMI 26.05 kg/m    Neurological Exam: MENTAL STATUS including orientation to time, place, person, recent and remote memory, attention span and concentration, language, and fund of knowledge is normal.  Speech is not dysarthric.  CRANIAL NERVES: II:  No visual field defects.     III-IV-VI: Pupils equal round and reactive to light.  Normal conjugate, extra-ocular eye movements in all directions of gaze.  No nystagmus.  Trace right ptosis with no worsening with up gaze.   V:  Normal facial sensation.    VII:  Normal facial symmetry and movements.   VIII:  Normal hearing and vestibular function.   IX-X:  Normal palatal movement.   XI:  Normal shoulder shrug and head rotation.   XII:  Normal tongue strength and range of motion, no deviation or fasciculation.  MOTOR:  Motor strength is 5/5 throughout.  No atrophy, fasciculations or abnormal movements.  No pronator drift.   MSRs:                                           Right        Left brachioradialis 2+  2+  biceps 2+  2+  triceps 2+  2+  patellar 2+  2+  ankle jerk 2+  2+  Hoffman no  no  plantar response down  down   SENSORY:  Normal and symmetric perception of light touch, pinprick, vibration, and temperature.  Romberg's sign absent.   COORDINATION/GAIT: Normal finger-to- nose-finger.  Intact rapid alternating movements bilaterally.  Able to rise from a chair without using arms.  Gait narrow based and stable. Stressed gait intact. Unsteady with tandem gait.     Thank you for allowing me to participate in patient's care.  If I can answer any additional questions, I would be pleased to do so.    Sincerely,    Ramses Klecka K. Allena Katz, DO

## 2022-12-09 NOTE — Patient Instructions (Signed)
Check labs  [facility] will contact you to schedule MRI brain  I will see you back in 6 weeks, or sooner as needed

## 2022-12-10 ENCOUNTER — Telehealth: Payer: Self-pay | Admitting: Neurology

## 2022-12-10 NOTE — Telephone Encounter (Signed)
Patient called after hours service, stating she had an appointment yesterday and has a follow up on 01/20/23 and she is suppose tp have a MRI  on 01/21/23 does she need the MRI prior to follow up ?

## 2022-12-14 NOTE — Telephone Encounter (Signed)
We can move her appointment until after her MRI. Please schedule follow-up on 9/24.  We will have her MRI results back by then.  Thank you.

## 2022-12-16 ENCOUNTER — Ambulatory Visit: Payer: Federal, State, Local not specified - PPO | Admitting: Neurology

## 2022-12-24 DIAGNOSIS — Z1211 Encounter for screening for malignant neoplasm of colon: Secondary | ICD-10-CM | POA: Diagnosis not present

## 2022-12-24 DIAGNOSIS — Z01818 Encounter for other preprocedural examination: Secondary | ICD-10-CM | POA: Diagnosis not present

## 2023-01-08 DIAGNOSIS — H6123 Impacted cerumen, bilateral: Secondary | ICD-10-CM | POA: Diagnosis not present

## 2023-01-08 DIAGNOSIS — H903 Sensorineural hearing loss, bilateral: Secondary | ICD-10-CM | POA: Diagnosis not present

## 2023-01-08 DIAGNOSIS — H9313 Tinnitus, bilateral: Secondary | ICD-10-CM | POA: Diagnosis not present

## 2023-01-12 DIAGNOSIS — D123 Benign neoplasm of transverse colon: Secondary | ICD-10-CM | POA: Diagnosis not present

## 2023-01-12 DIAGNOSIS — Z1211 Encounter for screening for malignant neoplasm of colon: Secondary | ICD-10-CM | POA: Diagnosis not present

## 2023-01-12 DIAGNOSIS — K648 Other hemorrhoids: Secondary | ICD-10-CM | POA: Diagnosis not present

## 2023-01-14 DIAGNOSIS — E1165 Type 2 diabetes mellitus with hyperglycemia: Secondary | ICD-10-CM | POA: Diagnosis not present

## 2023-01-14 DIAGNOSIS — E781 Pure hyperglyceridemia: Secondary | ICD-10-CM | POA: Diagnosis not present

## 2023-01-14 DIAGNOSIS — I10 Essential (primary) hypertension: Secondary | ICD-10-CM | POA: Diagnosis not present

## 2023-01-18 DIAGNOSIS — E785 Hyperlipidemia, unspecified: Secondary | ICD-10-CM | POA: Diagnosis not present

## 2023-01-18 DIAGNOSIS — M25432 Effusion, left wrist: Secondary | ICD-10-CM | POA: Diagnosis not present

## 2023-01-18 DIAGNOSIS — I10 Essential (primary) hypertension: Secondary | ICD-10-CM | POA: Diagnosis not present

## 2023-01-20 ENCOUNTER — Ambulatory Visit: Payer: Federal, State, Local not specified - PPO | Admitting: Neurology

## 2023-01-21 ENCOUNTER — Ambulatory Visit
Admission: RE | Admit: 2023-01-21 | Discharge: 2023-01-21 | Disposition: A | Discharge status: Home / Self Care | Payer: Federal, State, Local not specified - PPO | Source: Ambulatory Visit | Attending: Orthopedic Surgery | Admitting: Orthopedic Surgery

## 2023-01-21 DIAGNOSIS — H02401 Unspecified ptosis of right eyelid: Secondary | ICD-10-CM

## 2023-01-21 DIAGNOSIS — R519 Headache, unspecified: Secondary | ICD-10-CM

## 2023-01-21 DIAGNOSIS — H532 Diplopia: Secondary | ICD-10-CM

## 2023-01-21 MED ORDER — GADOPICLENOL 0.5 MMOL/ML IV SOLN
5.0000 mL | Freq: Once | INTRAVENOUS | Status: AC | PRN
  Administered 2023-01-21: 5 mL via INTRAVENOUS

## 2023-02-02 ENCOUNTER — Ambulatory Visit: Payer: Federal, State, Local not specified - PPO | Admitting: Neurology

## 2023-02-02 ENCOUNTER — Encounter: Payer: Self-pay | Admitting: Neurology

## 2023-02-02 VITALS — BP 118/74 | HR 74 | Ht 59.0 in | Wt 126.0 lb

## 2023-02-02 DIAGNOSIS — H532 Diplopia: Secondary | ICD-10-CM

## 2023-02-02 DIAGNOSIS — R519 Headache, unspecified: Secondary | ICD-10-CM

## 2023-02-02 NOTE — Progress Notes (Signed)
Follow-up Visit   Date: 02/02/2023    [name] MRN: [id number] DOB: April 02, 1961    Nancy Evans is a 62 y.o. right-handed female with  hypertension, hyperlipidemia, and diabetes mellitus (HbA1c 9.4) returning to the clinic for follow-up of diplopia.  The patient was accompanied to the clinic by self.   IMPRESSION/PLAN: Chronic daily headaches - resolved. No evidence of vascular abnormality on CTA head.  MRI shows partially empty sella, no evidence of papilledema on exam. If headaches return or she has new visual symptoms, lumbar puncture can be performed to evaluate for idiopathic intracranial hypertension   Diplopia - resolved.  AChR receptor are negative. No intracranial pathology to explain symptoms.  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  Encouraged better diabetes management and lifestyle choices.   Return to clinic as needed  --------------------------------------------- History of present illness: Starting around April/May, she began having a dull headache, over the back of the head and occasionally over the forehead.  Pain is daily.  No nausea, vomiting, photophobia, or phonophobia.  On 7/13, she began noticing double vision with images on top of each other.  Double vision improved with one eye closure.  No problems with swallowing/speech or weakness. No numbness/tingling. For the past few days, she has not noticed the double vision. Her vision remains impaired if she looks far to either side.    UPDATE 02/02/2023:  She is here for follow-up visit.  She denies ongoing double vision.  Headaches slowly started to improve a month ago and she has not had a headache in the past week.  Her diabetes has been poorly controlled which she attributes to going on vacation, but she will be more mindful of diet choices.   Medications:  Current Outpatient Medications on File Prior to Visit  Medication Sig Dispense Refill   amLODipine (NORVASC) 10 MG tablet  Take 1 tablet (10 mg total) by mouth daily. 90 tablet 3   aspirin 81 MG tablet Take 81 mg by mouth every evening.      azelastine (OPTIVAR) 0.05 % ophthalmic solution Apply to eye.     carvedilol (COREG) 25 MG tablet TAKE 1 TABLET BY MOUTH TWICE A DAY 180 tablet 0   Cetirizine HCl (ZYRTEC ALLERGY PO) Take by mouth.     cloNIDine HCl (KAPVAY) 0.1 MG TB12 ER tablet Take 1 tablet (0.1 mg total) by mouth at bedtime. 90 tablet 3   EPINEPHrine 0.3 mg/0.3 mL IJ SOAJ injection      galantamine (RAZADYNE) 12 MG tablet      Insulin Aspart FlexPen (NOVOLOG) 100 UNIT/ML Inject into the skin.     JARDIANCE 10 MG TABS tablet      Liver Extract (LIVER PO) Take by mouth.     losartan-hydrochlorothiazide (HYZAAR) 100-25 MG tablet TAKE ONE TABLET BY MOUTH EACH DAY 90 tablet 0   Magnesium 250 MG TABS 1 tablet with a meal Orally Once a day     metFORMIN (GLUCOPHAGE) 1000 MG tablet TAKE 1 TABLET BY MOUTH TWICE A DAY WITH A MEAL 60 tablet 0   mometasone (NASONEX) 50 MCG/ACT nasal spray Place 2 sprays into the nose as needed. 17 g 11   montelukast (SINGULAIR) 10 MG tablet      omega-3 acid ethyl esters (LOVAZA) 1 g capsule Take by mouth 2 (two) times daily.     simvastatin (ZOCOR) 40 MG tablet Take 1 tablet (40 mg total) by mouth every evening. 30 tablet 0  sitaGLIPtin (JANUVIA) 100 MG tablet Take 100 mg by mouth daily.     Turmeric 500 MG CAPS as directed Orally once a day     No current facility-administered medications on file prior to visit.    Allergies:  Allergies  Allergen Reactions   Lisinopril Swelling   Shrimp [Shellfish Allergy]     Vital Signs:  BP 118/74   Pulse 74   Ht 4\' 11"  (1.499 m)   Wt 126 lb (57.2 kg)   SpO2 98%   BMI 25.45 kg/m    Neurological Exam: MENTAL STATUS including orientation to time, place, person, recent and remote memory, attention span and concentration, language, and fund of knowledge is normal.  Speech is not dysarthric.  CRANIAL NERVES:  No visual field  defects.  Pupils equal round and reactive to light.  Normal conjugate, extra-ocular eye movements in all directions of gaze.  Trace right ptosis.  Face is symmetric. Palate elevates symmetrically.  Tongue is midline.  MOTOR:  Motor strength is 5/5 in all extremities.  No atrophy, fasciculations or abnormal movements.  No pronator drift.  Tone is normal.    MSRs:  Reflexes are 2+/4 throughout.  SENSORY:  Intact to vibration throughout.  COORDINATION/GAIT:  Normal finger-to- nose-finger.  Intact rapid alternating movements bilaterally.  Gait narrow based and stable.   Data: MRI brain wwo contrast 01/21/2023: 1. No evidence of an acute intracranial abnormality. 2. Mild T2 FLAIR hyperintense signal changes within the cerebral white matter, nonspecific but most often secondary to chronic small vessel ischemia. 3. Partially empty sella turcica. This finding can reflect incidental anatomic variation, or alternatively, it can be associated with idiopathic intracranial hypertension (pseudotumor cerebri).  CTA head wwo contrast 11/24/2022: 1. No acute intracranial abnormality. 2. No intracranial large vessel occlusion, significant stenosis, or aneurysm. 3. Enlarged and partially empty sella, which is nonspecific but can be seen in the setting of idiopathic intracranial hypertension.   Thank you for allowing me to participate in patient's care.  If I can answer any additional questions, I would be pleased to do so.    Sincerely,    Richmond Coldren K. Allena Katz, DO

## 2023-04-05 DIAGNOSIS — E781 Pure hyperglyceridemia: Secondary | ICD-10-CM | POA: Diagnosis not present

## 2023-04-05 DIAGNOSIS — R809 Proteinuria, unspecified: Secondary | ICD-10-CM | POA: Diagnosis not present

## 2023-04-05 DIAGNOSIS — E1165 Type 2 diabetes mellitus with hyperglycemia: Secondary | ICD-10-CM | POA: Diagnosis not present

## 2023-04-13 DIAGNOSIS — R809 Proteinuria, unspecified: Secondary | ICD-10-CM | POA: Diagnosis not present

## 2023-04-13 DIAGNOSIS — E781 Pure hyperglyceridemia: Secondary | ICD-10-CM | POA: Diagnosis not present

## 2023-04-13 DIAGNOSIS — E1165 Type 2 diabetes mellitus with hyperglycemia: Secondary | ICD-10-CM | POA: Diagnosis not present

## 2023-04-13 DIAGNOSIS — I10 Essential (primary) hypertension: Secondary | ICD-10-CM | POA: Diagnosis not present

## 2023-04-14 DIAGNOSIS — I10 Essential (primary) hypertension: Secondary | ICD-10-CM | POA: Diagnosis not present

## 2023-04-14 DIAGNOSIS — E785 Hyperlipidemia, unspecified: Secondary | ICD-10-CM | POA: Diagnosis not present

## 2023-04-17 DIAGNOSIS — E119 Type 2 diabetes mellitus without complications: Secondary | ICD-10-CM | POA: Diagnosis not present

## 2023-07-27 DIAGNOSIS — I10 Essential (primary) hypertension: Secondary | ICD-10-CM | POA: Diagnosis not present

## 2023-07-27 DIAGNOSIS — R809 Proteinuria, unspecified: Secondary | ICD-10-CM | POA: Diagnosis not present

## 2023-07-27 DIAGNOSIS — E781 Pure hyperglyceridemia: Secondary | ICD-10-CM | POA: Diagnosis not present

## 2023-07-27 DIAGNOSIS — E1165 Type 2 diabetes mellitus with hyperglycemia: Secondary | ICD-10-CM | POA: Diagnosis not present

## 2023-09-10 DIAGNOSIS — Z91013 Allergy to seafood: Secondary | ICD-10-CM | POA: Diagnosis not present

## 2023-09-10 DIAGNOSIS — J301 Allergic rhinitis due to pollen: Secondary | ICD-10-CM | POA: Diagnosis not present

## 2023-09-10 DIAGNOSIS — J3081 Allergic rhinitis due to animal (cat) (dog) hair and dander: Secondary | ICD-10-CM | POA: Diagnosis not present

## 2023-09-10 DIAGNOSIS — H1045 Other chronic allergic conjunctivitis: Secondary | ICD-10-CM | POA: Diagnosis not present

## 2023-12-07 DIAGNOSIS — E785 Hyperlipidemia, unspecified: Secondary | ICD-10-CM | POA: Diagnosis not present

## 2023-12-07 DIAGNOSIS — Z0000 Encounter for general adult medical examination without abnormal findings: Secondary | ICD-10-CM | POA: Diagnosis not present

## 2023-12-07 DIAGNOSIS — I10 Essential (primary) hypertension: Secondary | ICD-10-CM | POA: Diagnosis not present

## 2023-12-07 DIAGNOSIS — E781 Pure hyperglyceridemia: Secondary | ICD-10-CM | POA: Diagnosis not present

## 2024-01-07 ENCOUNTER — Ambulatory Visit (INDEPENDENT_AMBULATORY_CARE_PROVIDER_SITE_OTHER): Payer: Federal, State, Local not specified - PPO | Admitting: Otolaryngology

## 2024-01-07 ENCOUNTER — Encounter (INDEPENDENT_AMBULATORY_CARE_PROVIDER_SITE_OTHER): Payer: Self-pay | Admitting: Otolaryngology

## 2024-01-07 VITALS — BP 121/70 | HR 74

## 2024-01-07 DIAGNOSIS — H6123 Impacted cerumen, bilateral: Secondary | ICD-10-CM | POA: Diagnosis not present

## 2024-01-07 DIAGNOSIS — H903 Sensorineural hearing loss, bilateral: Secondary | ICD-10-CM | POA: Diagnosis not present

## 2024-01-07 DIAGNOSIS — H9313 Tinnitus, bilateral: Secondary | ICD-10-CM

## 2024-01-07 NOTE — Progress Notes (Signed)
 Patient ID: Nancy Evans, female   DOB: 1960-09-27, 63 y.o.   MRN: [id number]  Follow-up: Bilateral tinnitus, hearing loss  HPI: The patient is a 63 year old female who returns today for her follow-up evaluation.  The patient was last seen 1 year ago.  At that time, she was complaining of bilateral tinnitus for many years.  She was noted to have bilateral cerumen impaction and bilateral mild high-frequency sensorineural hearing loss.  She was treated with cerumen disimpaction.  The strategies to cope with tinnitus were discussed.  The patient returns today reporting improvement in her bilateral tinnitus.  She denies any otalgia, otorrhea, change in her hearing, or vertigo.  Exam: General: Communicates without difficulty, well nourished, no acute distress. Head: Normocephalic, no evidence injury, no tenderness, facial buttresses intact without stepoff. Face/sinus: No tenderness to palpation and percussion. Facial movement is normal and symmetric. Eyes: PERRL, EOMI. No scleral icterus, conjunctivae clear. Neuro: CN II exam reveals vision grossly intact.  No nystagmus at any point of gaze. EAC: Bilateral cerumen impaction.  Under the operating microscope, the cerumen is carefully removed with a combination of cerumen currette, alligator forceps, and suction catheters.  After the cerumen is removed, the TMs are noted to be normal. Nose: External evaluation reveals normal support and skin without lesions.  Dorsum is intact.  Anterior rhinoscopy reveals pink mucosa over anterior aspect of inferior turbinates and intact septum.  No purulence noted. Oral:  Oral cavity and oropharynx are intact, symmetric, without erythema or edema.  Mucosa is moist without lesions. Neck: Full range of motion without pain.  There is no significant lymphadenopathy.  No masses palpable.  Thyroid  bed within normal limits to palpation.  Parotid glands and submandibular glands equal bilaterally without mass.  Trachea is midline. Neuro:   CN 2-12 grossly intact. Gait normal.   Procedure: Bilateral cerumen disimpaction Anesthesia: None Description: Under the operating microscope, the cerumen is carefully removed with a combination of cerumen currette, alligator forceps, and suction catheters.  After the cerumen is removed, the TMs are noted to be normal.  No mass, erythema, or lesions. The patient tolerated the procedure well.   Assessment 1.  Bilateral cerumen impaction.  After the disimpaction procedure, both tympanic membranes and middle ear spaces are noted to be normal. 2.  Subjectively stable bilateral mild high-frequency sensorineural hearing loss. 3.  The patient's bilateral tinnitus is likely a direct result of the hearing loss.  Plan 1.  Otomicroscopy with bilateral cerumen disimpaction. 2.  The physical exam findings are reviewed with the patient.  3.  The strategies to cope with tinnitus, including the use of masker, hearing aids, tinnitus retraining therapy, and avoidance of caffeine and alcohol are again discussed.  4.  The patient will return for reevaluation in 1 year.

## 2024-01-08 DIAGNOSIS — H903 Sensorineural hearing loss, bilateral: Secondary | ICD-10-CM | POA: Insufficient documentation

## 2024-01-08 DIAGNOSIS — H9313 Tinnitus, bilateral: Secondary | ICD-10-CM | POA: Insufficient documentation

## 2024-01-08 DIAGNOSIS — H6123 Impacted cerumen, bilateral: Secondary | ICD-10-CM | POA: Insufficient documentation

## 2024-01-11 DIAGNOSIS — Z1231 Encounter for screening mammogram for malignant neoplasm of breast: Secondary | ICD-10-CM | POA: Diagnosis not present
